# Patient Record
Sex: Male | Born: 1970 | Race: White | Hispanic: No | Marital: Single | Smoking: Former smoker
Health system: Southern US, Community
[De-identification: ages and names within clinical notes are randomized; demographics above are authoritative.]

---

## 1993-08-26 HISTORY — PX: LIGAMENT REPAIR: SHX5444

## 2006-02-01 ENCOUNTER — Emergency Department: Payer: Self-pay | Admitting: Unknown Physician Specialty

## 2007-12-21 IMAGING — CR RIGHT ANKLE - COMPLETE 3+ VIEW
1 series · 5 of 5 positions shown · non-contrast
Comparison: none

REASON FOR EXAM: injury
COMMENTS:  LMP: (Male)

PROCEDURE:     DXR - DXR ANKLE RIGHT COMPLETE  - February 01, 2006  [DATE]
RESULT:          Soft tissue swelling is appreciated within the lateral
malleolar region.  No evidence of acute fracture or dislocation is
appreciated.

[Series 1: view not recorded · 0.17mm/px · 5 of 5 slices shown]
[im 1/5]
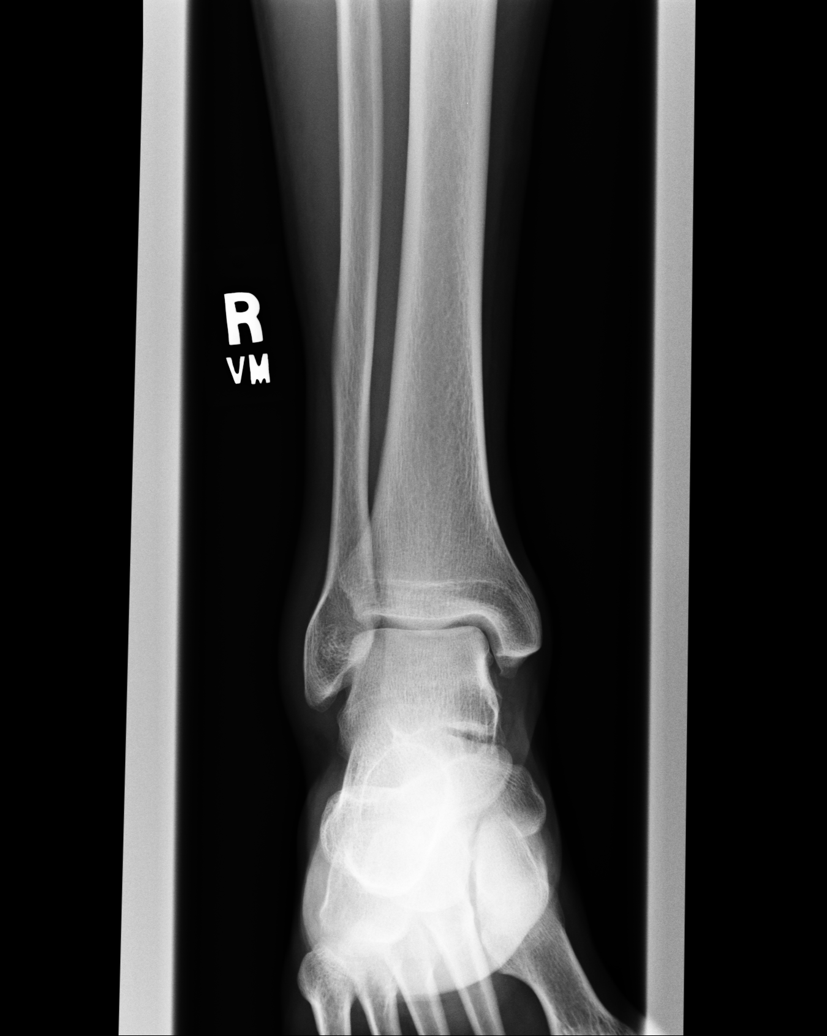
[im 2/5]
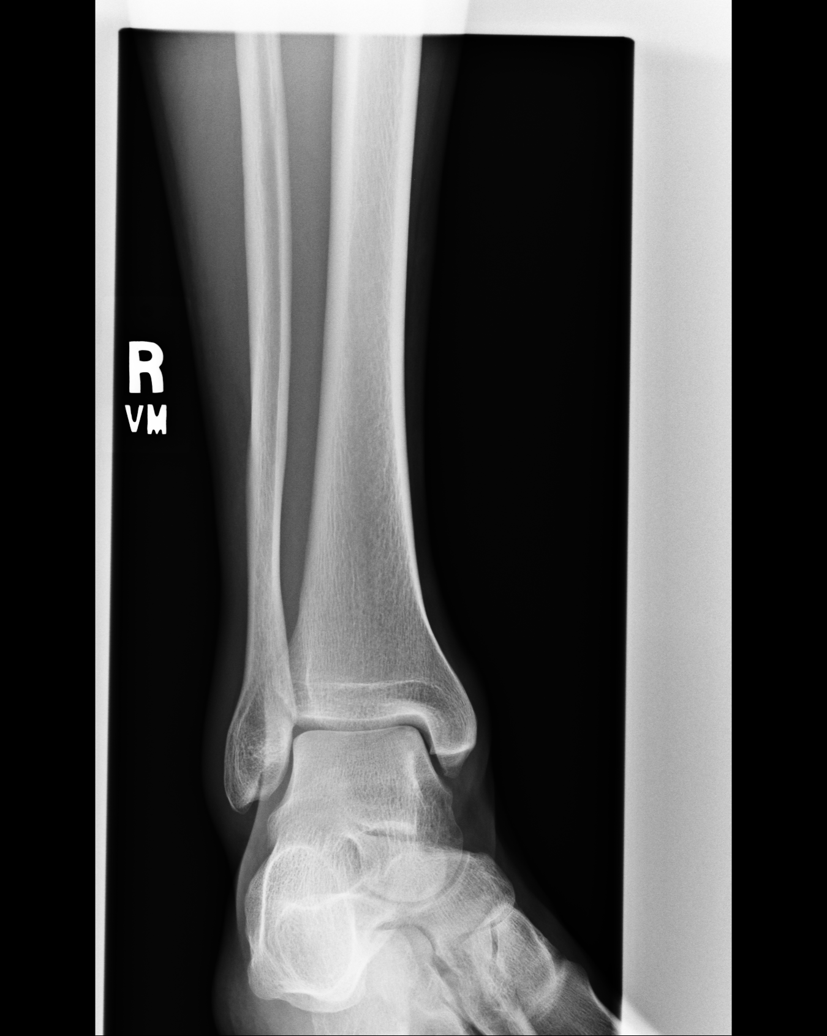
[im 3/5]
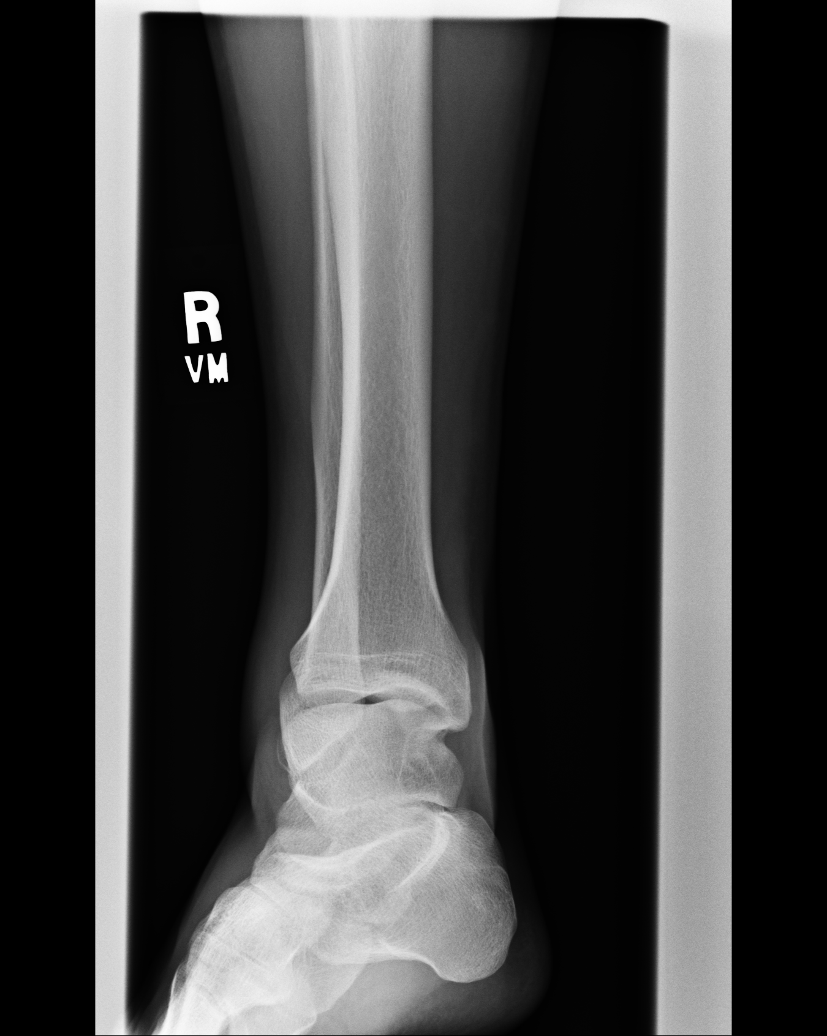
[im 4/5]
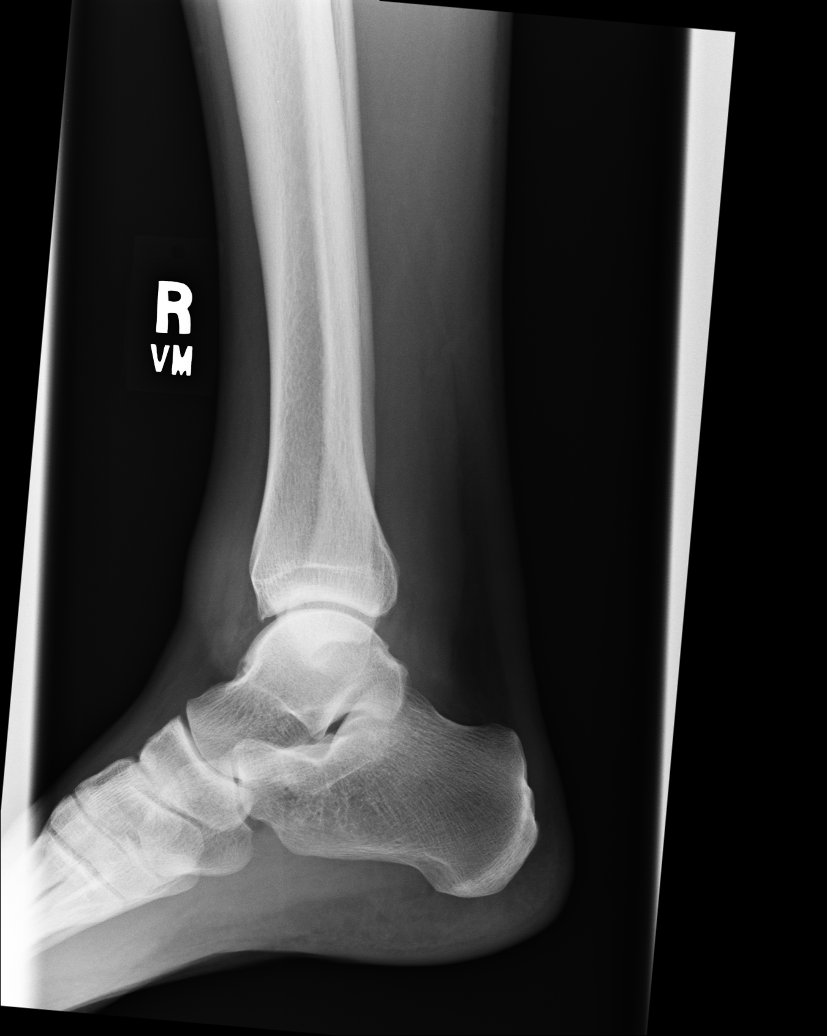
[im 5/5]
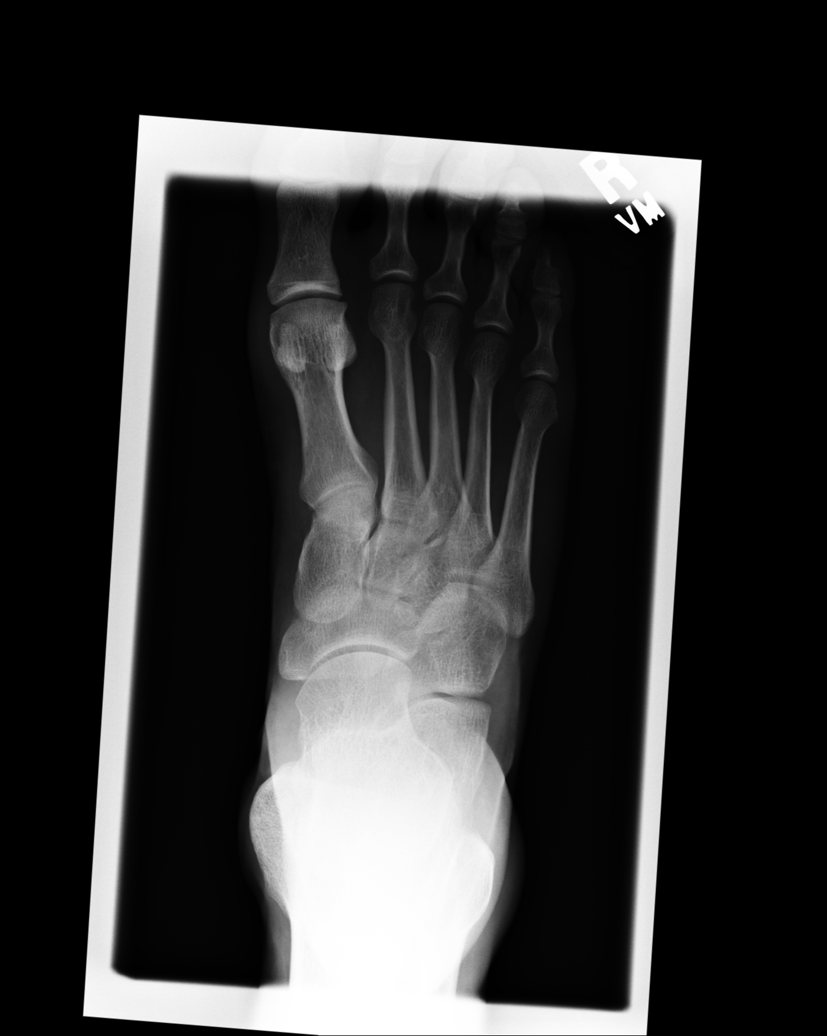

[5 of 5 positions shown; findings below may reference images not displayed]

IMPRESSION: Findings suggesting an element of ligamentous injury
involving the lateral malleolus region without evidence of acute fracture or
dislocation.

## 2017-05-09 ENCOUNTER — Ambulatory Visit: Payer: Self-pay | Admitting: Family Medicine

## 2017-05-12 ENCOUNTER — Encounter: Payer: Self-pay | Admitting: Family Medicine

## 2017-05-12 ENCOUNTER — Ambulatory Visit (INDEPENDENT_AMBULATORY_CARE_PROVIDER_SITE_OTHER): Payer: BLUE CROSS/BLUE SHIELD | Admitting: Family Medicine

## 2017-05-12 VITALS — BP 118/84 | HR 88 | Temp 97.6°F | Resp 16 | Ht 73.0 in | Wt 177.0 lb

## 2017-05-12 DIAGNOSIS — R1030 Lower abdominal pain, unspecified: Secondary | ICD-10-CM

## 2017-05-12 DIAGNOSIS — Z0000 Encounter for general adult medical examination without abnormal findings: Secondary | ICD-10-CM | POA: Diagnosis not present

## 2017-05-12 DIAGNOSIS — Z114 Encounter for screening for human immunodeficiency virus [HIV]: Secondary | ICD-10-CM | POA: Diagnosis not present

## 2017-05-12 DIAGNOSIS — R030 Elevated blood-pressure reading, without diagnosis of hypertension: Secondary | ICD-10-CM | POA: Diagnosis not present

## 2017-05-12 DIAGNOSIS — R109 Unspecified abdominal pain: Secondary | ICD-10-CM | POA: Insufficient documentation

## 2017-05-12 NOTE — Assessment & Plan Note (Signed)
Benign exam and no red flags Suspect gas and bloating from diet that is improving with decreased fried foods Return precautions discussed Continue to decreased high fat foods to see if this continues to help

## 2017-05-12 NOTE — Progress Notes (Signed)
Patient: Jeffrey Sanchez, Male    DOB: 09-Sep-1970, 46 y.o.   MRN: [id number] Visit Date: 05/12/2017  Today's Provider: Lavon Paganini, MD   Chief Complaint  Patient presents with  . Establish Care  . Annual Exam   Subjective:   Mr. Jeffrey Sanchez presents to establish care. Is is c/o intermittent lower abdominal pain - suprapubic. Worse after eating.  Occurs about once weekly.  Associated with bloating.  Better when not eating fried foods.  He has never had a colonoscopy. He denies diarrhea, constipation, melena, hematochezia, N/V, urinary complaints. He describes the pain as achy. He has not tried anything for the pain, and nothing improves the pain.  About one month ago, he noticed an episode of blurred vision, malaise and headache. He works outside. He checked his BP at that time, and it was 147/88. He denies syncope, presyncope.  Better after drinking water.  Thinks he was dehydrated that day as it was very hot.   Annual physical exam Jeffrey Sanchez is a 46 y.o. male who presents today for health maintenance and complete physical. He feels fairly well. He reports he is active at work, but is not regularly exercising. He reports he is sleeping fairly well.  -----------------------------------------------------------------   Review of Systems  Constitutional: Negative.   HENT: Positive for rhinorrhea and sneezing. Negative for congestion, dental problem, drooling, ear discharge, ear pain, facial swelling, hearing loss, mouth sores, nosebleeds, postnasal drip, sinus pain, sinus pressure, sore throat, tinnitus, trouble swallowing and voice change.   Eyes: Negative.   Respiratory: Negative.   Cardiovascular: Negative.   Gastrointestinal: Negative.   Endocrine: Negative.   Genitourinary: Negative.   Musculoskeletal: Negative.   Skin: Negative.   Allergic/Immunologic: Negative.   Neurological: Negative.   Hematological: Negative.   Psychiatric/Behavioral: Negative.     Social  History      He  reports that he quit smoking about 2 years ago. He has a 56.00 pack-year smoking history. His smokeless tobacco use includes Chew. He reports that he drinks alcohol. He reports that he does not use drugs.       Social History   Social History  . Marital status: Single    Spouse name: N/A  . Number of children: 1  . Years of education: HS   Occupational History  .  [employer]   Social History Main Topics  . Smoking status: Former Smoker    Packs/day: 2.00    Years: 28.00    Quit date: 08/25/2014  . Smokeless tobacco: Current User    Types: Chew  . Alcohol use Yes     Comment: occasionally  . Drug use: No  . Sexual activity: Yes   Other Topics Concern  . None   Social History Narrative  . None    History reviewed. No pertinent past medical history.   There are no active problems to display for this patient.   Past Surgical History:  Procedure Laterality Date  . LIGAMENT REPAIR Left    Ankle    Family History        Family Status  Relation Status  . Mother Deceased  . Father Alive  . Sister Alive  . Brother Alive  . Daughter (Not Specified)  . Brother Alive        His family history includes Cancer in his mother; Healthy in his brother, brother, father, and sister; Hyperlipidemia in his daughter.     No Known Allergies  No current outpatient prescriptions on file.   Patient Care Team: Virginia Crews, MD as PCP - General (Family Medicine)      Objective:   Vitals: BP 118/84 (BP Location: Left Arm, Patient Position: Sitting, Cuff Size: Normal)   Pulse 88   Temp 97.6 F (36.4 C) (Oral)   Resp 16   Ht 6\' 1"  (1.854 m)   Wt 177 lb (80.3 kg)   BMI 23.35 kg/m    Vitals:   05/12/17 0942  BP: 118/84  Pulse: 88  Resp: 16  Temp: 97.6 F (36.4 C)  TempSrc: Oral  Weight: 177 lb (80.3 kg)  Height: 6\' 1"  (1.854 m)     Physical Exam  Constitutional: He is oriented to person, place, and time. He appears  well-developed and well-nourished. No distress.  HENT:  Head: Normocephalic and atraumatic.  Right Ear: External ear normal.  Left Ear: External ear normal.  Nose: Nose normal.  Mouth/Throat: Oropharynx is clear and moist.  Eyes: Conjunctivae are normal. No scleral icterus.  Neck: Neck supple. No thyromegaly present.  Cardiovascular: Normal rate, regular rhythm, normal heart sounds and intact distal pulses.   No murmur heard. Pulmonary/Chest: Effort normal and breath sounds normal. No respiratory distress. He has no wheezes. He has no rales.  Abdominal: Soft. Bowel sounds are normal. He exhibits no distension. There is no tenderness. There is no rebound and no guarding. No hernia.  Musculoskeletal: He exhibits no edema or deformity.  Lymphadenopathy:    He has no cervical adenopathy.  Neurological: He is alert and oriented to person, place, and time.  Skin: Skin is warm and dry. No rash noted.  Psychiatric: He has a normal mood and affect. His behavior is normal.  Vitals reviewed.    Depression Screen PHQ 2/9 Scores 05/12/2017  PHQ - 2 Score 0     Assessment & Plan:     Routine Health Maintenance and Physical Exam  Exercise Activities and Dietary recommendations Goals    None       There is no immunization history on file for this patient.  Health Maintenance  Topic Date Due  . HIV Screening  08/25/1984  . TETANUS/TDAP  08/25/1988  . INFLUENZA VACCINE  03/26/2017     Discussed health benefits of physical activity, and encouraged him to engage in regular exercise appropriate for his age and condition.  - declined flu vaccine - will give TDAP at next visit as our stock is currently unavailable   -------------------------------------------------------------------- Problem List Items Addressed This Visit      Other   Elevated BP without diagnosis of hypertension    Well controlled today No indication for medications at this time Could have been related to heat  and dehydration Advised to keep BP log and RTC in 3 months to re-eval      Relevant Orders   COMPLETE METABOLIC PANEL WITH GFR   Lipid panel   Abdominal pain    Benign exam and no red flags Suspect gas and bloating from diet that is improving with decreased fried foods Return precautions discussed Continue to decreased high fat foods to see if this continues to help       Other Visit Diagnoses    Healthcare maintenance    -  Primary   Relevant Orders   COMPLETE METABOLIC PANEL WITH GFR   CBC w/Diff/Platelet   Lipid panel   Screening for HIV (human immunodeficiency virus)       Relevant Orders   HIV  antibody (with reflex)     Return in about 3 months (around 08/11/2017) for BP.   The entirety of the information documented in the History of Present Illness, Review of Systems and Physical Exam were personally obtained by me. Portions of this information were initially documented by Raquel Sarna Ratchford, CMA and reviewed by me for thoroughness and accuracy.    Lavon Paganini, MD  [facility]

## 2017-05-12 NOTE — Patient Instructions (Signed)
Hypertension Hypertension is another name for high blood pressure. High blood pressure forces your heart to work harder to pump blood. This can cause problems over time. There are two numbers in a blood pressure reading. There is a top number (systolic) over a bottom number (diastolic). It is best to have a blood pressure below 120/80. Healthy choices can help lower your blood pressure. You may need medicine to help lower your blood pressure if:  Your blood pressure cannot be lowered with healthy choices.  Your blood pressure is higher than 130/80.  Follow these instructions at home: Eating and drinking  If directed, follow the DASH eating plan. This diet includes: ? Filling half of your plate at each meal with fruits and vegetables. ? Filling one quarter of your plate at each meal with whole grains. Whole grains include whole wheat pasta, brown rice, and whole grain bread. ? Eating or drinking low-fat dairy products, such as skim milk or low-fat yogurt. ? Filling one quarter of your plate at each meal with low-fat (lean) proteins. Low-fat proteins include fish, skinless chicken, eggs, beans, and tofu. ? Avoiding fatty meat, cured and processed meat, or chicken with skin. ? Avoiding premade or processed food.  Eat less than 1,500 mg of salt (sodium) a day.  Limit alcohol use to no more than 1 drink a day for nonpregnant women and 2 drinks a day for men. One drink equals 12 oz of beer, 5 oz of wine, or 1 oz of hard liquor. Lifestyle  Work with your doctor to stay at a healthy weight or to lose weight. Ask your doctor what the best weight is for you.  Get at least 30 minutes of exercise that causes your heart to beat faster (aerobic exercise) most days of the week. This may include walking, swimming, or biking.  Get at least 30 minutes of exercise that strengthens your muscles (resistance exercise) at least 3 days a week. This may include lifting weights or pilates.  Do not use any  products that contain nicotine or tobacco. This includes cigarettes and e-cigarettes. If you need help quitting, ask your doctor.  Check your blood pressure at home as told by your doctor.  Keep all follow-up visits as told by your doctor. This is important. Medicines  Take over-the-counter and prescription medicines only as told by your doctor. Follow directions carefully.  Do not skip doses of blood pressure medicine. The medicine does not work as well if you skip doses. Skipping doses also puts you at risk for problems.  Ask your doctor about side effects or reactions to medicines that you should watch for. Contact a doctor if:  You think you are having a reaction to the medicine you are taking.  You have headaches that keep coming back (recurring).  You feel dizzy.  You have swelling in your ankles.  You have trouble with your vision. Get help right away if:  You get a very bad headache.  You start to feel confused.  You feel weak or numb.  You feel faint.  You get very bad pain in your: ? Chest. ? Belly (abdomen).  You throw up (vomit) more than once.  You have trouble breathing. Summary  Hypertension is another name for high blood pressure.  Making healthy choices can help lower blood pressure. If your blood pressure cannot be controlled with healthy choices, you may need to take medicine. This information is not intended to replace advice given to you by your health care   provider. Make sure you discuss any questions you have with your health care provider. Document Released: 01/29/2008 Document Revised: 07/10/2016 Document Reviewed: 07/10/2016 Elsevier Interactive Patient Education  2018 [place] Years, Male Preventive care refers to lifestyle choices and visits with your health care provider that can promote health and wellness. What does preventive care include?  A yearly physical exam. This is also called an annual well  check.  Dental exams once or twice a year.  Routine eye exams. Ask your health care provider how often you should have your eyes checked.  Personal lifestyle choices, including: ? Daily care of your teeth and gums. ? Regular physical activity. ? Eating a healthy diet. ? Avoiding tobacco and drug use. ? Limiting alcohol use. ? Practicing safe sex. ? Taking low-dose aspirin every day starting at age 29. What happens during an annual well check? The services and screenings done by your health care provider during your annual well check will depend on your age, overall health, lifestyle risk factors, and family history of disease. Counseling Your health care provider may ask you questions about your:  Alcohol use.  Tobacco use.  Drug use.  Emotional well-being.  Home and relationship well-being.  Sexual activity.  Eating habits.  Work and work Statistician.  Screening You may have the following tests or measurements:  Height, weight, and BMI.  Blood pressure.  Lipid and cholesterol levels. These may be checked every 5 years, or more frequently if you are over 31 years old.  Skin check.  Lung cancer screening. You may have this screening every year starting at age 81 if you have a 30-pack-year history of smoking and currently smoke or have quit within the past 15 years.  Fecal occult blood test (FOBT) of the stool. You may have this test every year starting at age 77.  Flexible sigmoidoscopy or colonoscopy. You may have a sigmoidoscopy every 5 years or a colonoscopy every 10 years starting at age 81.  Prostate cancer screening. Recommendations will vary depending on your family history and other risks.  Hepatitis C blood test.  Hepatitis B blood test.  Sexually transmitted disease (STD) testing.  Diabetes screening. This is done by checking your blood sugar (glucose) after you have not eaten for a while (fasting). You may have this done every 1-3  years.  Discuss your test results, treatment options, and if necessary, the need for more tests with your health care provider. Vaccines Your health care provider may recommend certain vaccines, such as:  Influenza vaccine. This is recommended every year.  Tetanus, diphtheria, and acellular pertussis (Tdap, Td) vaccine. You may need a Td booster every 10 years.  Varicella vaccine. You may need this if you have not been vaccinated.  Zoster vaccine. You may need this after age 17.  Measles, mumps, and rubella (MMR) vaccine. You may need at least one dose of MMR if you were born in 1957 or later. You may also need a second dose.  Pneumococcal 13-valent conjugate (PCV13) vaccine. You may need this if you have certain conditions and have not been vaccinated.  Pneumococcal polysaccharide (PPSV23) vaccine. You may need one or two doses if you smoke cigarettes or if you have certain conditions.  Meningococcal vaccine. You may need this if you have certain conditions.  Hepatitis A vaccine. You may need this if you have certain conditions or if you travel or work in places where you may be exposed to hepatitis A.  Hepatitis B vaccine. You may need this if you have certain conditions or if you travel or work in places where you may be exposed to hepatitis B.  Haemophilus influenzae type b (Hib) vaccine. You may need this if you have certain risk factors.  Talk to your health care provider about which screenings and vaccines you need and how often you need them. This information is not intended to replace advice given to you by your health care provider. Make sure you discuss any questions you have with your health care provider. Document Released: 09/08/2015 Document Revised: 05/01/2016 Document Reviewed: 06/13/2015 Elsevier Interactive Patient Education  2017 [employer].

## 2017-05-12 NOTE — Assessment & Plan Note (Signed)
Well controlled today No indication for medications at this time Could have been related to heat and dehydration Advised to keep BP log and RTC in 3 months to re-eval

## 2017-05-13 ENCOUNTER — Telehealth: Payer: Self-pay

## 2017-05-13 LAB — COMPLETE METABOLIC PANEL WITH GFR
AG Ratio: 1.9 (calc) (ref 1.0–2.5)
ALT: 50 U/L — AB (ref 9–46)
AST: 23 U/L (ref 10–40)
Albumin: 5.1 g/dL (ref 3.6–5.1)
Alkaline phosphatase (APISO): 60 U/L (ref 40–115)
BILIRUBIN TOTAL: 0.6 mg/dL (ref 0.2–1.2)
BUN: 12 mg/dL (ref 7–25)
CHLORIDE: 99 mmol/L (ref 98–110)
CO2: 31 mmol/L (ref 20–32)
CREATININE: 0.96 mg/dL (ref 0.60–1.35)
Calcium: 10 mg/dL (ref 8.6–10.3)
GFR, EST AFRICAN AMERICAN: 109 mL/min/{1.73_m2} (ref >=60)
GFR, Est Non African American: 94 mL/min/{1.73_m2} (ref >=60)
Globulin: 2.7 g/dL (calc) (ref 1.9–3.7)
Glucose, Bld: 104 mg/dL — ABNORMAL HIGH (ref 65–99)
Potassium: 4.4 mmol/L (ref 3.5–5.3)
Sodium: 138 mmol/L (ref 135–146)
TOTAL PROTEIN: 7.8 g/dL (ref 6.1–8.1)

## 2017-05-13 LAB — CBC WITH DIFFERENTIAL/PLATELET
BASOS ABS: 59 {cells}/uL (ref 0–200)
Basophils Relative: 0.9 %
Eosinophils Absolute: 251 cells/uL (ref 15–500)
Eosinophils Relative: 3.8 %
HEMATOCRIT: 50.4 % — AB (ref 38.5–50.0)
HEMOGLOBIN: 17.3 g/dL — AB (ref 13.2–17.1)
LYMPHS ABS: 2244 {cells}/uL (ref 850–3900)
MCH: 29.4 pg (ref 27.0–33.0)
MCHC: 34.3 g/dL (ref 32.0–36.0)
MCV: 85.7 fL (ref 80.0–100.0)
MPV: 10.2 fL (ref 7.5–12.5)
Monocytes Relative: 8.2 %
NEUTROS ABS: 3505 {cells}/uL (ref 1500–7800)
Neutrophils Relative %: 53.1 %
Platelets: 267 10*3/uL (ref 140–400)
RBC: 5.88 10*6/uL — AB (ref 4.20–5.80)
RDW: 13.5 % (ref 11.0–15.0)
Total Lymphocyte: 34 %
WBC: 6.6 10*3/uL (ref 3.8–10.8)
WBCMIX: 541 {cells}/uL (ref 200–950)

## 2017-05-13 LAB — LIPID PANEL
CHOL/HDL RATIO: 5.6 (calc) — AB (ref <5.0)
Cholesterol: 257 mg/dL — ABNORMAL HIGH (ref <200)
HDL: 46 mg/dL (ref >40)
LDL Cholesterol (Calc): 170 mg/dL (calc) — ABNORMAL HIGH
NON-HDL CHOLESTEROL (CALC): 211 mg/dL — AB (ref <130)
TRIGLYCERIDES: 244 mg/dL — AB (ref <150)

## 2017-05-13 LAB — HIV ANTIBODY (ROUTINE TESTING W REFLEX): HIV: NONREACTIVE

## 2017-05-13 NOTE — Telephone Encounter (Signed)
-----   Message from Virginia Crews, MD sent at 05/13/2017 11:24 AM EDT ----- Negative HIV screening.  Normal kidney function, liver function, electrolytes (except, blood sugar is slightly elevated if he was fasting and ALT a liver function testing is barely elevated above normal).  Normal blood counts.  Cholesterol is high, but not to a level that requires medicaiton absolutely at this time.  Would recommend decreasing carbohydrate and fatty food intake and getting regular exercise.  Virginia Crews, MD, MPH [facility] 05/13/2017 11:23 AM

## 2017-05-13 NOTE — Telephone Encounter (Signed)
lmtcb

## 2017-05-13 NOTE — Telephone Encounter (Signed)
Patient advised as directed below.  Thanks,  -Daiton Cowles 

## 2017-05-14 ENCOUNTER — Telehealth: Payer: Self-pay

## 2017-05-14 NOTE — Telephone Encounter (Signed)
Patient advised as below. Patient verbalizes understanding and is in agreement with treatment plan. Patient reports he had regular soda and coffee before blood drawn.

## 2017-05-14 NOTE — Telephone Encounter (Signed)
-----   Message from Virginia Crews, MD sent at 05/13/2017 11:24 AM EDT ----- Negative HIV screening.  Normal kidney function, liver function, electrolytes (except, blood sugar is slightly elevated if he was fasting and ALT a liver function testing is barely elevated above normal).  Normal blood counts.  Cholesterol is high, but not to a level that requires medicaiton absolutely at this time.  Would recommend decreasing carbohydrate and fatty food intake and getting regular exercise.  Virginia Crews, MD, MPH [facility] 05/13/2017 11:23 AM

## 2017-08-11 ENCOUNTER — Other Ambulatory Visit: Payer: Self-pay

## 2017-08-11 ENCOUNTER — Encounter: Payer: Self-pay | Admitting: Family Medicine

## 2017-08-11 ENCOUNTER — Ambulatory Visit: Payer: BLUE CROSS/BLUE SHIELD | Admitting: Family Medicine

## 2017-08-11 VITALS — BP 104/78 | HR 93 | Temp 98.2°F | Resp 16 | Wt 178.0 lb

## 2017-08-11 DIAGNOSIS — R740 Nonspecific elevation of levels of transaminase and lactic acid dehydrogenase [LDH]: Secondary | ICD-10-CM | POA: Diagnosis not present

## 2017-08-11 DIAGNOSIS — R7401 Elevation of levels of liver transaminase levels: Secondary | ICD-10-CM

## 2017-08-11 DIAGNOSIS — Z23 Encounter for immunization: Secondary | ICD-10-CM

## 2017-08-11 DIAGNOSIS — R030 Elevated blood-pressure reading, without diagnosis of hypertension: Secondary | ICD-10-CM | POA: Diagnosis not present

## 2017-08-11 DIAGNOSIS — D582 Other hemoglobinopathies: Secondary | ICD-10-CM

## 2017-08-11 NOTE — Progress Notes (Signed)
Patient: Jeffrey Sanchez Male    DOB: [birth year]   46 y.o.   MRN: [id number] Visit Date: 08/11/2017  Today's Provider: Lavon Paganini, MD   Chief Complaint  Patient presents with  . Elevated Blood Pressure Follow Up   I, Emily Ratchford, CMA, am acting as scribe for Lavon Paganini, MD.  Subjective:    HPI     Follow up for Elevated BP without HTN Diagnosis  The patient was last seen for this 3 months ago. Changes made at last visit include none; pt was advised elevated BP could have been secondary to heat and dehydration. Pt was advised to keep BP log and FU in 3 months.  He reports fair compliance with treatment. He states he does not have a way to monitor his BP, but he also states he has not had any problems since the first incident.  He feels that condition is Improved.  ------------------------------------------------------------------------------------ BP Readings from Last 3 Encounters:  08/11/17 104/78  05/12/17 118/84     No Known Allergies  No current outpatient medications on file.  Review of Systems  Constitutional: Negative for activity change, appetite change, chills, diaphoresis, fatigue, fever and unexpected weight change.  Respiratory: Negative for shortness of breath.   Cardiovascular: Negative for chest pain, palpitations and leg swelling.    Social History   Tobacco Use  . Smoking status: Former Smoker    Packs/day: 2.00    Years: 28.00    Pack years: 56.00    Types: Cigarettes    Last attempt to quit: 08/25/2014    Years since quitting: 2.9  . Smokeless tobacco: Current User    Types: Chew  Substance Use Topics  . Alcohol use: Yes    Comment: occasionally   Objective:   BP 104/78 (BP Location: Left Arm, Patient Position: Sitting, Cuff Size: Normal)   Pulse 93   Temp 98.2 F (36.8 C) (Oral)   Resp 16   Wt 178 lb (80.7 kg)   SpO2 96%   BMI 23.48 kg/m  Vitals:   08/11/17 1509  BP: 104/78  Pulse: 93  Resp: 16    Temp: 98.2 F (36.8 C)  TempSrc: Oral  SpO2: 96%  Weight: 178 lb (80.7 kg)     Physical Exam  Constitutional: He appears well-developed and well-nourished. No distress.  HENT:  Head: Normocephalic and atraumatic.  Eyes: Conjunctivae are normal.  Neck: Neck supple. No thyromegaly present.  Cardiovascular: Normal rate, regular rhythm, normal heart sounds and intact distal pulses.  No murmur heard. Pulmonary/Chest: Effort normal and breath sounds normal. No respiratory distress. He has no wheezes. He has no rales.  Musculoskeletal: He exhibits no edema or deformity.  Lymphadenopathy:    He has no cervical adenopathy.  Neurological: He is alert.  Skin: Skin is warm and dry. No rash noted.  Psychiatric: He has a normal mood and affect. His behavior is normal.  Vitals reviewed.       Assessment & Plan:     1. Elevated BP without diagnosis of hypertension - now resolved - suspect incident with 1 time elevated BP was related to being overheated at work/heat exhaustion - can continue to monitor at CPE  2. Elevated ALT measurement, 3. Elevated hemoglobin (HCC) - CBC w/Diff/Platelet, CMP with GFR - suspect both related to heat exhaustion/dehydration at initial visit - asymptomatic - will recheck labs  4. Need for Tdap vaccination - Tdap vaccine greater than or equal to 7yo IM  Return in about 1 year (around 08/11/2018) for physical.     The entirety of the information documented in the History of Present Illness, Review of Systems and Physical Exam were personally obtained by me. Portions of this information were initially documented by Raquel Sarna Ratchford, CMA and reviewed by me for thoroughness and accuracy.    Virginia Crews, MD, MPH [facility] 08/13/2017 8:12 AM

## 2017-08-12 LAB — CBC WITH DIFFERENTIAL/PLATELET
BASOS PCT: 1.2 %
Basophils Absolute: 73 cells/uL (ref 0–200)
EOS ABS: 220 {cells}/uL (ref 15–500)
EOS PCT: 3.6 %
HCT: 43.7 % (ref 38.5–50.0)
HEMOGLOBIN: 15.5 g/dL (ref 13.2–17.1)
Lymphs Abs: 2031 cells/uL (ref 850–3900)
MCH: 29.8 pg (ref 27.0–33.0)
MCHC: 35.5 g/dL (ref 32.0–36.0)
MCV: 83.9 fL (ref 80.0–100.0)
MPV: 9.9 fL (ref 7.5–12.5)
Monocytes Relative: 7.7 %
NEUTROS ABS: 3306 {cells}/uL (ref 1500–7800)
Neutrophils Relative %: 54.2 %
PLATELETS: 262 10*3/uL (ref 140–400)
RBC: 5.21 10*6/uL (ref 4.20–5.80)
RDW: 13 % (ref 11.0–15.0)
TOTAL LYMPHOCYTE: 33.3 %
WBC mixed population: 470 cells/uL (ref 200–950)
WBC: 6.1 10*3/uL (ref 3.8–10.8)

## 2017-08-12 LAB — COMPLETE METABOLIC PANEL WITH GFR
AG RATIO: 2 (calc) (ref 1.0–2.5)
ALT: 27 U/L (ref 9–46)
AST: 13 U/L (ref 10–40)
Albumin: 4.5 g/dL (ref 3.6–5.1)
Alkaline phosphatase (APISO): 45 U/L (ref 40–115)
BILIRUBIN TOTAL: 0.5 mg/dL (ref 0.2–1.2)
BUN: 12 mg/dL (ref 7–25)
CO2: 25 mmol/L (ref 20–32)
Calcium: 9.1 mg/dL (ref 8.6–10.3)
Chloride: 102 mmol/L (ref 98–110)
Creat: 0.97 mg/dL (ref 0.60–1.35)
GFR, Est African American: 107 mL/min/{1.73_m2} (ref >=60)
GFR, Est Non African American: 93 mL/min/{1.73_m2} (ref >=60)
Globulin: 2.2 g/dL (calc) (ref 1.9–3.7)
Glucose, Bld: 124 mg/dL — ABNORMAL HIGH (ref 65–99)
POTASSIUM: 4 mmol/L (ref 3.5–5.3)
Sodium: 139 mmol/L (ref 135–146)
Total Protein: 6.7 g/dL (ref 6.1–8.1)

## 2017-09-02 ENCOUNTER — Encounter: Payer: Self-pay | Admitting: Physician Assistant

## 2017-09-02 ENCOUNTER — Ambulatory Visit (INDEPENDENT_AMBULATORY_CARE_PROVIDER_SITE_OTHER): Payer: BLUE CROSS/BLUE SHIELD | Admitting: Physician Assistant

## 2017-09-02 VITALS — BP 142/88 | HR 100 | Temp 98.3°F | Resp 16 | Wt 179.0 lb

## 2017-09-02 DIAGNOSIS — J029 Acute pharyngitis, unspecified: Secondary | ICD-10-CM | POA: Diagnosis not present

## 2017-09-02 MED ORDER — AMOXICILLIN-POT CLAVULANATE 875-125 MG PO TABS: 1.0000 | ORAL_TABLET | Freq: Two times a day (BID) | ORAL | 0 refills | Status: AC

## 2017-09-02 NOTE — Patient Instructions (Signed)

## 2017-09-02 NOTE — Progress Notes (Signed)
Patient: Jeffrey Sanchez Male    DOB: 11-06-1970   47 y.o.   MRN: [id number] Visit Date: 09/02/2017  Today's Provider: Trinna Post, PA-C   Chief Complaint  Patient presents with  . Sore Throat   Subjective:    Jeffrey Sanchez is a 47 y.o man presenting today for sore throat. Pertinents below.  Sore Throat   This is a new problem. The current episode started in the past 7 days. The problem has been gradually worsening. The pain is worse on the right side. Associated symptoms include congestion, coughing, headaches, neck pain, swollen glands and trouble swallowing. Pertinent negatives include no abdominal pain, diarrhea, drooling, ear discharge, ear pain, hoarse voice, plugged ear sensation, shortness of breath or stridor. He has tried NSAIDs for the symptoms. The treatment provided no relief.       No Known Allergies  No current outpatient medications on file.  Review of Systems  Constitutional: Positive for fatigue. Negative for activity change, appetite change, chills, diaphoresis, fever and unexpected weight change.  HENT: Positive for congestion, sinus pressure, sinus pain, sore throat and trouble swallowing. Negative for dental problem, drooling, ear discharge, ear pain, facial swelling, hearing loss, hoarse voice, mouth sores, nosebleeds, postnasal drip, rhinorrhea, sneezing, tinnitus and voice change.   Respiratory: Positive for cough and chest tightness. Negative for apnea, choking, shortness of breath, wheezing and stridor.   Gastrointestinal: Negative.  Negative for abdominal pain and diarrhea.  Musculoskeletal: Positive for neck pain.  Neurological: Positive for headaches. Negative for dizziness and light-headedness.    Social History   Tobacco Use  . Smoking status: Former Smoker    Packs/day: 2.00    Years: 28.00    Pack years: 56.00    Types: Cigarettes    Last attempt to quit: 08/25/2014    Years since quitting: 3.0  . Smokeless tobacco: Current User   Types: Chew  Substance Use Topics  . Alcohol use: Yes    Comment: occasionally   Objective:   BP (!) 142/88 (BP Location: Right Arm, Patient Position: Sitting, Cuff Size: Normal)   Pulse 100   Temp 98.3 F (36.8 C) (Oral)   Resp 16   Wt 179 lb (81.2 kg)   BMI 23.62 kg/m  Vitals:   09/02/17 1649  BP: (!) 142/88  Pulse: 100  Resp: 16  Temp: 98.3 F (36.8 C)  TempSrc: Oral  Weight: 179 lb (81.2 kg)     Physical Exam  Constitutional: He is oriented to person, place, and time. He appears well-developed and well-nourished.  HENT:  Right Ear: Tympanic membrane and external ear normal.  Left Ear: Tympanic membrane and external ear normal.  Mouth/Throat: Oropharyngeal exudate, posterior oropharyngeal edema and posterior oropharyngeal erythema present.  Hypertrophic tonsils with exudate.  Cardiovascular: Normal rate and regular rhythm.  Pulmonary/Chest: Effort normal and breath sounds normal.  Lymphadenopathy:    He has cervical adenopathy.  Neurological: He is alert and oriented to person, place, and time.  Skin: Skin is warm and dry.  Psychiatric: He has a normal mood and affect. His behavior is normal.        Assessment & Plan:     1. Pharyngitis, unspecified etiology  Rapid strep negative, however oropharynx is extremely edematous and tonsils are hypertrophic with some exudate. Will cover for bacterial pharyngitis.  - amoxicillin-clavulanate (AUGMENTIN) 875-125 MG tablet; Take 1 tablet by mouth 2 (two) times daily for 7 days.  Dispense: 14 tablet; Refill: 0 -  POCT rapid strep A  Return if symptoms worsen or fail to improve.  The entirety of the information documented in the History of Present Illness, Review of Systems and Physical Exam were personally obtained by me. Portions of this information were initially documented by Ashley Royalty, CMA and reviewed by me for thoroughness and accuracy.        Trinna Post, PA-C  [facility]

## 2017-09-03 LAB — POCT RAPID STREP A (OFFICE): Rapid Strep A Screen: NEGATIVE

## 2017-12-07 DIAGNOSIS — J029 Acute pharyngitis, unspecified: Secondary | ICD-10-CM | POA: Diagnosis not present

## 2017-12-07 DIAGNOSIS — B9689 Other specified bacterial agents as the cause of diseases classified elsewhere: Secondary | ICD-10-CM | POA: Diagnosis not present

## 2017-12-07 DIAGNOSIS — R05 Cough: Secondary | ICD-10-CM | POA: Diagnosis not present

## 2017-12-07 DIAGNOSIS — J019 Acute sinusitis, unspecified: Secondary | ICD-10-CM | POA: Diagnosis not present

## 2018-04-30 ENCOUNTER — Ambulatory Visit (INDEPENDENT_AMBULATORY_CARE_PROVIDER_SITE_OTHER): Payer: BLUE CROSS/BLUE SHIELD | Admitting: Family Medicine

## 2018-04-30 ENCOUNTER — Encounter: Payer: Self-pay | Admitting: Family Medicine

## 2018-04-30 VITALS — BP 118/84 | HR 83 | Temp 98.0°F | Wt 179.2 lb

## 2018-04-30 DIAGNOSIS — J301 Allergic rhinitis due to pollen: Secondary | ICD-10-CM

## 2018-04-30 DIAGNOSIS — J0100 Acute maxillary sinusitis, unspecified: Secondary | ICD-10-CM

## 2018-04-30 MED ORDER — AMOXICILLIN-POT CLAVULANATE 875-125 MG PO TABS: 1.0000 | ORAL_TABLET | Freq: Two times a day (BID) | ORAL | 0 refills | Status: AC

## 2018-04-30 MED ORDER — FLUTICASONE PROPIONATE 50 MCG/ACT NA SUSP: 2.0000 | Freq: Every day | NASAL | 6 refills | Status: DC

## 2018-04-30 NOTE — Progress Notes (Signed)
Patient: Jeffrey Sanchez Male    DOB: 1970/09/24   47 y.o.   MRN: [id number] Visit Date: 04/30/2018  Today's Provider: Lavon Paganini, MD   Chief Complaint  Patient presents with  . URI   Subjective:    I, Tiburcio Pea, CMA, am acting as a scribe for Lavon Paganini, MD.   URI   This is a new problem. Episode onset: 10 days ago. The problem has been gradually worsening. There has been no fever. Associated symptoms include congestion, coughing, headaches, rhinorrhea, sinus pain, sneezing and a sore throat. Pertinent negatives include no ear pain or wheezing. Treatments tried: DyQuil and NyQuil. The treatment provided no relief.   He was treated for bacterial sinusitis in 11/2017 and states that the symptoms are very similar to then.  He works outside and does have allergy symptoms frequently.  He does not take antihistamines or any other treatment for allergies.    No Known Allergies  No current outpatient medications on file.  Review of Systems  Constitutional: Negative.   HENT: Positive for congestion, rhinorrhea, sinus pressure, sinus pain, sneezing and sore throat. Negative for dental problem, drooling, ear discharge, ear pain, facial swelling, hearing loss, mouth sores, nosebleeds, postnasal drip, tinnitus, trouble swallowing and voice change.   Eyes: Negative.   Respiratory: Positive for cough. Negative for apnea, choking, chest tightness, shortness of breath, wheezing and stridor.   Cardiovascular: Negative.   Musculoskeletal: Negative.   Neurological: Positive for headaches. Negative for dizziness, tremors, seizures, syncope, facial asymmetry, speech difficulty, weakness, light-headedness and numbness.  Psychiatric/Behavioral: Negative.     Social History   Tobacco Use  . Smoking status: Former Smoker    Packs/day: 2.00    Years: 28.00    Pack years: 56.00    Types: Cigarettes    Last attempt to quit: 08/25/2014    Years since quitting: 3.6  . Smokeless tobacco:  Current User    Types: Chew  Substance Use Topics  . Alcohol use: Yes    Comment: occasionally   Objective:   BP 118/84 (BP Location: Right Arm, Patient Position: Sitting, Cuff Size: Normal)   Pulse 83   Temp 98 F (36.7 C) (Oral)   Wt 179 lb 3.2 oz (81.3 kg)   SpO2 98%   BMI 23.64 kg/m  Vitals:   04/30/18 1450  BP: 118/84  Pulse: 83  Temp: 98 F (36.7 C)  TempSrc: Oral  SpO2: 98%  Weight: 179 lb 3.2 oz (81.3 kg)     Physical Exam  Constitutional: He is oriented to person, place, and time. He appears well-developed and well-nourished. No distress.  HENT:  Head: Normocephalic and atraumatic.  Right Ear: Tympanic membrane, external ear and ear canal normal.  Left Ear: Tympanic membrane, external ear and ear canal normal.  Nose: Mucosal edema present. Right sinus exhibits maxillary sinus tenderness and frontal sinus tenderness. Left sinus exhibits maxillary sinus tenderness and frontal sinus tenderness.  Mouth/Throat: Uvula is midline and mucous membranes are normal. Posterior oropharyngeal erythema present. No oropharyngeal exudate or posterior oropharyngeal edema.  Eyes: Pupils are equal, round, and reactive to light. Conjunctivae are normal. Right eye exhibits no discharge. Left eye exhibits no discharge. No scleral icterus.  Neck: Neck supple. No thyromegaly present.  Cardiovascular: Normal rate, regular rhythm, normal heart sounds and intact distal pulses.  No murmur heard. Pulmonary/Chest: Effort normal and breath sounds normal. No respiratory distress. He has no wheezes. He has no rales.  Musculoskeletal: He exhibits no edema.  Lymphadenopathy:    He has no cervical adenopathy.  Neurological: He is alert and oriented to person, place, and time.  Skin: Skin is warm and dry. Capillary refill takes less than 2 seconds. No rash noted.  Psychiatric: He has a normal mood and affect. His behavior is normal.  Vitals reviewed.      Assessment & Plan:   1. Acute  non-recurrent maxillary sinusitis -Symptoms consistent with sinusitis -Given duration of symptoms, more likely to be a bacterial etiology -Discussed importance of symptomatic management with decongestants and nasal sprays -Suspect that initial congestion stems from allergic rhinitis that is untreated -Treat with Augmentin 7-day course -Discussed return precautions  2. Seasonal allergic rhinitis due to pollen -New diagnosis - Seems to have issues with sinusitis around turns of seasons which is likely mediated by allergic rhinitis - Start daily antihistamine -Start Flonase treatment    Meds ordered this encounter  Medications  . amoxicillin-clavulanate (AUGMENTIN) 875-125 MG tablet    Sig: Take 1 tablet by mouth 2 (two) times daily for 7 days.    Dispense:  14 tablet    Refill:  0  . fluticasone (FLONASE) 50 MCG/ACT nasal spray    Sig: Place 2 sprays into both nostrils daily.    Dispense:  16 g    Refill:  6     Return if symptoms worsen or fail to improve.   The entirety of the information documented in the History of Present Illness, Review of Systems and Physical Exam were personally obtained by me. Portions of this information were initially documented by Tiburcio Pea, CMA and reviewed by me for thoroughness and accuracy.    Virginia Crews, MD, MPH [facility] 04/30/2018 4:31 PM

## 2018-04-30 NOTE — Patient Instructions (Signed)

## 2018-08-12 ENCOUNTER — Encounter: Payer: Self-pay | Admitting: Family Medicine

## 2018-10-19 ENCOUNTER — Encounter: Payer: BLUE CROSS/BLUE SHIELD | Admitting: Family Medicine

## 2018-10-19 NOTE — Progress Notes (Deleted)
Patient: Jeffrey Sanchez, Male    DOB: Aug 26, 1971, 48 y.o.   MRN: [id number] Visit Date: 10/19/2018  Today's Provider: Lavon Paganini, MD   No chief complaint on file.  Subjective:     Annual physical exam Jeffrey Sanchez is a 48 y.o. male who presents today for health maintenance and complete physical. He feels {DESC; WELL/FAIRLY WELL/POORLY:18703}. He reports exercising ***. He reports he is sleeping {DESC; WELL/FAIRLY WELL/POORLY:18703}.  -----------------------------------------------------------------   Review of Systems  Constitutional: Negative.   HENT: Negative.   Eyes: Negative.   Respiratory: Negative.   Cardiovascular: Negative.   Gastrointestinal: Negative.   Endocrine: Negative.   Genitourinary: Negative.   Musculoskeletal: Negative.   Skin: Negative.   Allergic/Immunologic: Negative.   Neurological: Negative.   Hematological: Negative.   Psychiatric/Behavioral: Negative.     Social History      He  reports that he quit smoking about 4 years ago. His smoking use included cigarettes. He has a 56.00 pack-year smoking history. His smokeless tobacco use includes chew. He reports current alcohol use. He reports that he does not use drugs.       Social History   Socioeconomic History  . Marital status: Single    Spouse name: Not on file  . Number of children: 1  . Years of education: HS  . Highest education level: Not on file  Occupational History    Employer: [employer]  Social Needs  . Financial resource strain: Not on file  . Food insecurity:    Worry: Not on file    Inability: Not on file  . Transportation needs:    Medical: Not on file    Non-medical: Not on file  Tobacco Use  . Smoking status: Former Smoker    Packs/day: 2.00    Years: 28.00    Pack years: 56.00    Types: Cigarettes    Last attempt to quit: 08/25/2014    Years since quitting: 4.1  . Smokeless tobacco: Current User    Types: Chew  Substance and Sexual  Activity  . Alcohol use: Yes    Comment: occasionally  . Drug use: No  . Sexual activity: Yes    Birth control/protection: None  Lifestyle  . Physical activity:    Days per week: Not on file    Minutes per session: Not on file  . Stress: Not on file  Relationships  . Social connections:    Talks on phone: Not on file    Gets together: Not on file    Attends religious service: Not on file    Active member of club or organization: Not on file    Attends meetings of clubs or organizations: Not on file    Relationship status: Not on file  Other Topics Concern  . Not on file  Social History Narrative  . Not on file    No past medical history on file.   There are no active problems to display for this patient.   Past Surgical History:  Procedure Laterality Date  . LIGAMENT REPAIR Left 1995   Ankle    Family History        Family Status  Relation Name Status  . Mother  Deceased  . Father  Alive  . Sister  Alive  . Brother  Alive  . Brother  Alive  . Neg Hx  (Not Specified)        His family history includes Cancer in his  mother; Healthy in his brother, brother, and sister; Hypertension in his father. There is no history of Colon cancer, Prostate cancer, or Breast cancer.      No Known Allergies   Current Outpatient Medications:  .  fluticasone (FLONASE) 50 MCG/ACT nasal spray, Place 2 sprays into both nostrils daily., Disp: 16 g, Rfl: 6   Patient Care Team: Virginia Crews, MD as PCP - General (Family Medicine)    Objective:    Vitals: There were no vitals taken for this visit.  There were no vitals filed for this visit.   Physical Exam   Depression Screen PHQ 2/9 Scores 05/12/2017  PHQ - 2 Score 0       Assessment & Plan:     Routine Health Maintenance and Physical Exam  Exercise Activities and Dietary recommendations Goals   None     Immunization History  Administered Date(s) Administered  . Tdap 08/11/2017    Health  Maintenance  Topic Date Due  . INFLUENZA VACCINE  03/26/2018  . TETANUS/TDAP  08/12/2027  . HIV Screening  Completed     Discussed health benefits of physical activity, and encouraged him to engage in regular exercise appropriate for his age and condition.    --------------------------------------------------------------------    Lavon Paganini, MD  [facility]

## 2018-10-26 ENCOUNTER — Ambulatory Visit (INDEPENDENT_AMBULATORY_CARE_PROVIDER_SITE_OTHER): Payer: BLUE CROSS/BLUE SHIELD | Admitting: Family Medicine

## 2018-10-26 ENCOUNTER — Encounter: Payer: Self-pay | Admitting: Family Medicine

## 2018-10-26 VITALS — BP 122/86 | HR 101 | Temp 98.1°F | Resp 16 | Ht 70.5 in | Wt 179.4 lb

## 2018-10-26 DIAGNOSIS — R0989 Other specified symptoms and signs involving the circulatory and respiratory systems: Secondary | ICD-10-CM

## 2018-10-26 DIAGNOSIS — R739 Hyperglycemia, unspecified: Secondary | ICD-10-CM

## 2018-10-26 DIAGNOSIS — E663 Overweight: Secondary | ICD-10-CM | POA: Diagnosis not present

## 2018-10-26 DIAGNOSIS — K219 Gastro-esophageal reflux disease without esophagitis: Secondary | ICD-10-CM | POA: Diagnosis not present

## 2018-10-26 DIAGNOSIS — Z0000 Encounter for general adult medical examination without abnormal findings: Secondary | ICD-10-CM | POA: Diagnosis not present

## 2018-10-26 DIAGNOSIS — R198 Other specified symptoms and signs involving the digestive system and abdomen: Secondary | ICD-10-CM | POA: Insufficient documentation

## 2018-10-26 MED ORDER — OMEPRAZOLE 20 MG PO CPDR: 20.0000 mg | DELAYED_RELEASE_CAPSULE | Freq: Every day | ORAL | 3 refills | Status: DC

## 2018-10-26 NOTE — Assessment & Plan Note (Signed)
See plan above for GERD

## 2018-10-26 NOTE — Patient Instructions (Signed)
Gastroesophageal Reflux Disease, Adult Gastroesophageal reflux (GER) happens when acid from the stomach flows up into the tube that connects the mouth and the stomach (esophagus). Normally, food travels down the esophagus and stays in the stomach to be digested. With GER, food and stomach acid sometimes move back up into the esophagus. You may have a disease called gastroesophageal reflux disease (GERD) if the reflux:  Happens often.  Causes frequent or very bad symptoms.  Causes problems such as damage to the esophagus. When this happens, the esophagus becomes sore and swollen (inflamed). Over time, GERD can make small holes (ulcers) in the lining of the esophagus. What are the causes? This condition is caused by a problem with the muscle between the esophagus and the stomach. When this muscle is weak or not normal, it does not close properly to keep food and acid from coming back up from the stomach. The muscle can be weak because of:  Tobacco use.  Pregnancy.  Having a certain type of hernia (hiatal hernia).  Alcohol use.  Certain foods and drinks, such as coffee, chocolate, onions, and peppermint. What increases the risk? You are more likely to develop this condition if you:  Are overweight.  Have a disease that affects your connective tissue.  Use NSAID medicines. What are the signs or symptoms? Symptoms of this condition include:  Heartburn.  Difficult or painful swallowing.  The feeling of having a lump in the throat.  A bitter taste in the mouth.  Bad breath.  Having a lot of saliva.  Having an upset or bloated stomach.  Belching.  Chest pain. Different conditions can cause chest pain. Make sure you see your doctor if you have chest pain.  Shortness of breath or noisy breathing (wheezing).  Ongoing (chronic) cough or a cough at night.  Wearing away of the surface of teeth (tooth enamel).  Weight loss. How is this treated? Treatment will depend on how  bad your symptoms are. Your doctor may suggest:  Changes to your diet.  Medicine.  Surgery. Follow these instructions at home: Eating and drinking   Follow a diet as told by your doctor. You may need to avoid foods and drinks such as: ? Coffee and tea (with or without caffeine). ? Drinks that contain alcohol. ? Energy drinks and sports drinks. ? Bubbly (carbonated) drinks or sodas. ? Chocolate and cocoa. ? Peppermint and mint flavorings. ? Garlic and onions. ? Horseradish. ? Spicy and acidic foods. These include peppers, chili powder, curry powder, vinegar, hot sauces, and BBQ sauce. ? Citrus fruit juices and citrus fruits, such as oranges, lemons, and limes. ? Tomato-based foods. These include red sauce, chili, salsa, and pizza with red sauce. ? Fried and fatty foods. These include donuts, french fries, potato chips, and high-fat dressings. ? High-fat meats. These include hot dogs, rib eye steak, sausage, ham, and bacon. ? High-fat dairy items, such as whole milk, butter, and cream cheese.  Eat small meals often. Avoid eating large meals.  Avoid drinking large amounts of liquid with your meals.  Avoid eating meals during the 2-3 hours before bedtime.  Avoid lying down right after you eat.  Do not exercise right after you eat. Lifestyle   Do not use any products that contain nicotine or tobacco. These include cigarettes, e-cigarettes, and chewing tobacco. If you need help quitting, ask your doctor.  Try to lower your stress. If you need help doing this, ask your doctor.  If you are overweight, lose an amount  of weight that is healthy for you. Ask your doctor about a safe weight loss goal. General instructions  Pay attention to any changes in your symptoms.  Take over-the-counter and prescription medicines only as told by your doctor. Do not take aspirin, ibuprofen, or other NSAIDs unless your doctor says it is okay.  Wear loose clothes. Do not wear anything tight  around your waist.  Raise (elevate) the head of your bed about 6 inches (15 cm).  Avoid bending over if this makes your symptoms worse.  Keep all follow-up visits as told by your doctor. This is important. Contact a doctor if:  You have new symptoms.  You lose weight and you do not know why.  You have trouble swallowing or it hurts to swallow.  You have wheezing or a cough that keeps happening.  Your symptoms do not get better with treatment.  You have a hoarse voice. Get help right away if:  You have pain in your arms, neck, jaw, teeth, or back.  You feel sweaty, dizzy, or light-headed.  You have chest pain or shortness of breath.  You throw up (vomit) and your throw-up looks like blood or coffee grounds.  You pass out (faint).  Your poop (stool) is bloody or black.  You cannot swallow, drink, or eat. Summary  If a person has gastroesophageal reflux disease (GERD), food and stomach acid move back up into the esophagus and cause symptoms or problems such as damage to the esophagus.  Treatment will depend on how bad your symptoms are.  Follow a diet as told by your doctor.  Take all medicines only as told by your doctor. This information is not intended to replace advice given to you by your health care provider. Make sure you discuss any questions you have with your health care provider. Document Released: 01/29/2008 Document Revised: 02/18/2018 Document Reviewed: 02/18/2018 Elsevier Interactive Patient Education  2019 [place] Years, Male Preventive care refers to lifestyle choices and visits with your health care provider that can promote health and wellness. What does preventive care include?   A yearly physical exam. This is also called an annual well check.  Dental exams once or twice a year.  Routine eye exams. Ask your health care provider how often you should have your eyes checked.  Personal lifestyle choices,  including: ? Daily care of your teeth and gums. ? Regular physical activity. ? Eating a healthy diet. ? Avoiding tobacco and drug use. ? Limiting alcohol use. ? Practicing safe sex. ? Taking low-dose aspirin every day starting at age 18. What happens during an annual well check? The services and screenings done by your health care provider during your annual well check will depend on your age, overall health, lifestyle risk factors, and family history of disease. Counseling Your health care provider may ask you questions about your:  Alcohol use.  Tobacco use.  Drug use.  Emotional well-being.  Home and relationship well-being.  Sexual activity.  Eating habits.  Work and work Statistician. Screening You may have the following tests or measurements:  Height, weight, and BMI.  Blood pressure.  Lipid and cholesterol levels. These may be checked every 5 years, or more frequently if you are over 63 years old.  Skin check.  Lung cancer screening. You may have this screening every year starting at age 53 if you have a 30-pack-year history of smoking and currently smoke or have quit within the past 15 years.  Colorectal cancer  screening. All adults should have this screening starting at age 30 and continuing until age 23. Your health care provider may recommend screening at age 73. You will have tests every 1-10 years, depending on your results and the type of screening test. People at increased risk should start screening at an earlier age. Screening tests may include: ? Guaiac-based fecal occult blood testing. ? Fecal immunochemical test (FIT). ? Stool DNA test. ? Virtual colonoscopy. ? Sigmoidoscopy. During this test, a flexible tube with a tiny camera (sigmoidoscope) is used to examine your rectum and lower colon. The sigmoidoscope is inserted through your anus into your rectum and lower colon. ? Colonoscopy. During this test, a long, thin, flexible tube with a tiny camera  (colonoscope) is used to examine your entire colon and rectum.  Prostate cancer screening. Recommendations will vary depending on your family history and other risks.  Hepatitis C blood test.  Hepatitis B blood test.  Sexually transmitted disease (STD) testing.  Diabetes screening. This is done by checking your blood sugar (glucose) after you have not eaten for a while (fasting). You may have this done every 1-3 years. Discuss your test results, treatment options, and if necessary, the need for more tests with your health care provider. Vaccines Your health care provider may recommend certain vaccines, such as:  Influenza vaccine. This is recommended every year.  Tetanus, diphtheria, and acellular pertussis (Tdap, Td) vaccine. You may need a Td booster every 10 years.  Varicella vaccine. You may need this if you have not been vaccinated.  Zoster vaccine. You may need this after age 83.  Measles, mumps, and rubella (MMR) vaccine. You may need at least one dose of MMR if you were born in 1957 or later. You may also need a second dose.  Pneumococcal 13-valent conjugate (PCV13) vaccine. You may need this if you have certain conditions and have not been vaccinated.  Pneumococcal polysaccharide (PPSV23) vaccine. You may need one or two doses if you smoke cigarettes or if you have certain conditions.  Meningococcal vaccine. You may need this if you have certain conditions.  Hepatitis A vaccine. You may need this if you have certain conditions or if you travel or work in places where you may be exposed to hepatitis A.  Hepatitis B vaccine. You may need this if you have certain conditions or if you travel or work in places where you may be exposed to hepatitis B.  Haemophilus influenzae type b (Hib) vaccine. You may need this if you have certain risk factors. Talk to your health care provider about which screenings and vaccines you need and how often you need them. This information is not  intended to replace advice given to you by your health care provider. Make sure you discuss any questions you have with your health care provider. Document Released: 09/08/2015 Document Revised: 10/02/2017 Document Reviewed: 06/13/2015 Elsevier Interactive Patient Education  2019 [employer].

## 2018-10-26 NOTE — Progress Notes (Deleted)
Patient: Jeffrey Sanchez, Male    DOB: 1970-10-19, 48 y.o.   MRN: [id number] Visit Date: 10/26/2018  Today's Provider: Lavon Paganini, MD   No chief complaint on file.  Subjective:    Annual physical exam Jeffrey Sanchez is a 48 y.o. male who presents today for health maintenance and complete physical. He feels {DESC; WELL/FAIRLY WELL/POORLY:18703}. He reports exercising ***. He reports he is sleeping {DESC; WELL/FAIRLY WELL/POORLY:18703}.  -----------------------------------------------------------------   Review of Systems  Constitutional: Negative.   HENT: Negative.   Eyes: Negative.   Respiratory: Negative.   Cardiovascular: Negative.   Gastrointestinal: Negative.   Endocrine: Negative.   Genitourinary: Negative.   Musculoskeletal: Negative.   Skin: Negative.   Allergic/Immunologic: Negative.   Neurological: Negative.   Hematological: Negative.   Psychiatric/Behavioral: Negative.     Social History He  reports that he quit smoking about 4 years ago. His smoking use included cigarettes. He has a 56.00 pack-year smoking history. His smokeless tobacco use includes chew. He reports current alcohol use. He reports that he does not use drugs. Social History   Socioeconomic History  . Marital status: Single    Spouse name: Not on file  . Number of children: 1  . Years of education: HS  . Highest education level: Not on file  Occupational History    Employer: [employer]  Social Needs  . Financial resource strain: Not on file  . Food insecurity:    Worry: Not on file    Inability: Not on file  . Transportation needs:    Medical: Not on file    Non-medical: Not on file  Tobacco Use  . Smoking status: Former Smoker    Packs/day: 2.00    Years: 28.00    Pack years: 56.00    Types: Cigarettes    Last attempt to quit: 08/25/2014    Years since quitting: 4.1  . Smokeless tobacco: Current User    Types: Chew  Substance and Sexual Activity  . Alcohol  use: Yes    Comment: occasionally  . Drug use: No  . Sexual activity: Yes    Birth control/protection: None  Lifestyle  . Physical activity:    Days per week: Not on file    Minutes per session: Not on file  . Stress: Not on file  Relationships  . Social connections:    Talks on phone: Not on file    Gets together: Not on file    Attends religious service: Not on file    Active member of club or organization: Not on file    Attends meetings of clubs or organizations: Not on file    Relationship status: Not on file  Other Topics Concern  . Not on file  Social History Narrative  . Not on file    There are no active problems to display for this patient.   Past Surgical History:  Procedure Laterality Date  . LIGAMENT REPAIR Left 1995   Ankle    Family History  Family Status  Relation Name Status  . Mother  Deceased  . Father  Alive  . Sister  Alive  . Brother  Alive  . Brother  Alive  . Neg Hx  (Not Specified)   His family history includes Cancer in his mother; Healthy in his brother, brother, and sister; Hypertension in his father.     No Known Allergies  Previous Medications   FLUTICASONE (FLONASE) 50 MCG/ACT NASAL SPRAY  Place 2 sprays into both nostrils daily.    Patient Care Team: Virginia Crews, MD as PCP - General (Family Medicine)      Objective:   Vitals: There were no vitals taken for this visit.   Physical Exam   Depression Screen PHQ 2/9 Scores 05/12/2017  PHQ - 2 Score 0      Assessment & Plan:     Routine Health Maintenance and Physical Exam  Exercise Activities and Dietary recommendations Goals   None     Immunization History  Administered Date(s) Administered  . Tdap 08/11/2017    Health Maintenance  Topic Date Due  . INFLUENZA VACCINE  03/26/2018  . TETANUS/TDAP  08/12/2027  . HIV Screening  Completed     Discussed health benefits of physical activity, and encouraged him to engage in regular exercise  appropriate for his age and condition.    --------------------------------------------------------------------

## 2018-10-26 NOTE — Assessment & Plan Note (Signed)
Suspect that globus sensation is related to untreated GERD We will start omeprazole 20 mg daily If continues to have symptoms despite treatment, will consider referral to GI for possible EGD for further evaluation

## 2018-10-26 NOTE — Assessment & Plan Note (Signed)
Discussed diet and exercise 

## 2018-10-26 NOTE — Progress Notes (Signed)
Patient: Jeffrey Sanchez, Male    DOB: 11-Oct-1970, 48 y.o.   MRN: [id number] Visit Date: 10/26/2018  Today's Provider: Lavon Paganini, MD   Chief Complaint  Patient presents with  . Annual Exam   Subjective:    Annual physical exam Jeffrey Sanchez is a 48 y.o. male who presents today for health maintenance and complete physical. He feels well. He reports he is not actively exercising, but stays active with work duties. He reports he is sleeping well. Patients last reported Tdap 08/11/17, patient has declined flu vaccine.    Feels food gets stuck intermittently when swallowing, especially in lower esophagus.  Never had this previously. +GERD symptoms, but doesn't take any medications -----------------------------------------------------------------   Review of Systems  Constitutional: Negative.   HENT: Positive for sinus pressure. Negative for congestion, dental problem, drooling, ear discharge, ear pain, facial swelling, hearing loss, mouth sores, nosebleeds, postnasal drip, rhinorrhea, sinus pain, sneezing, sore throat, tinnitus, trouble swallowing and voice change.   Eyes: Negative.   Respiratory: Negative.   Cardiovascular: Negative.   Gastrointestinal: Negative.   Endocrine: Negative.   Genitourinary: Negative.   Musculoskeletal: Positive for arthralgias. Negative for back pain, gait problem, joint swelling, myalgias, neck pain and neck stiffness.  Skin: Negative.   Allergic/Immunologic: Positive for environmental allergies. Negative for food allergies and immunocompromised state.  Neurological: Negative.   Hematological: Negative.   Psychiatric/Behavioral: Negative.     Social History He  reports that he quit smoking about 4 years ago. His smoking use included cigarettes. He has a 56.00 pack-year smoking history. His smokeless tobacco use includes chew. He reports current alcohol use. He reports that he does not use drugs. Social History   Socioeconomic History  .  Marital status: Single    Spouse name: Not on file  . Number of children: 1  . Years of education: HS  . Highest education level: Not on file  Occupational History    Employer: [employer]  Social Needs  . Financial resource strain: Not on file  . Food insecurity:    Worry: Not on file    Inability: Not on file  . Transportation needs:    Medical: Not on file    Non-medical: Not on file  Tobacco Use  . Smoking status: Former Smoker    Packs/day: 2.00    Years: 28.00    Pack years: 56.00    Types: Cigarettes    Last attempt to quit: 08/25/2014    Years since quitting: 4.1  . Smokeless tobacco: Current User    Types: Chew  Substance and Sexual Activity  . Alcohol use: Yes    Comment: occasionally  . Drug use: No  . Sexual activity: Yes    Birth control/protection: None  Lifestyle  . Physical activity:    Days per week: Not on file    Minutes per session: Not on file  . Stress: Not on file  Relationships  . Social connections:    Talks on phone: Not on file    Gets together: Not on file    Attends religious service: Not on file    Active member of club or organization: Not on file    Attends meetings of clubs or organizations: Not on file    Relationship status: Not on file  Other Topics Concern  . Not on file  Social History Narrative  . Not on file    There are no active problems to display for this  patient.   Past Surgical History:  Procedure Laterality Date  . LIGAMENT REPAIR Left 1995   Ankle    Family History  Family Status  Relation Name Status  . Mother  Deceased  . Father  Alive  . Sister  Alive  . Brother  Alive  . Brother  Alive  . Neg Hx  (Not Specified)   His family history includes Cancer in his mother; Healthy in his brother, brother, and sister; Hypertension in his father.     No Known Allergies  Previous Medications   FLUTICASONE (FLONASE) 50 MCG/ACT NASAL SPRAY    Place 2 sprays into both nostrils daily.    Patient  Care Team: Virginia Crews, MD as PCP - General (Family Medicine)      Objective:   Vitals: BP 122/86   Pulse (!) 101   Temp 98.1 F (36.7 C) (Oral)   Resp 16   Ht 5' 10.5" (1.791 m)   Wt 179 lb 6.4 oz (81.4 kg)   BMI 25.38 kg/m    Physical Exam Vitals signs reviewed.  Constitutional:      General: He is not in acute distress.    Appearance: Normal appearance. He is well-developed. He is not diaphoretic.  HENT:     Head: Normocephalic and atraumatic.     Right Ear: Tympanic membrane, ear canal and external ear normal.     Left Ear: Tympanic membrane, ear canal and external ear normal.     Nose: Nose normal. No congestion.     Mouth/Throat:     Mouth: Mucous membranes are moist.     Pharynx: Oropharynx is clear. No oropharyngeal exudate.  Eyes:     General: No scleral icterus.    Conjunctiva/sclera: Conjunctivae normal.     Pupils: Pupils are equal, round, and reactive to light.  Neck:     Musculoskeletal: Neck supple.     Thyroid: No thyromegaly.  Cardiovascular:     Rate and Rhythm: Normal rate and regular rhythm.     Pulses: Normal pulses.     Heart sounds: Normal heart sounds. No murmur.  Pulmonary:     Effort: Pulmonary effort is normal. No respiratory distress.     Breath sounds: Normal breath sounds. No wheezing or rales.  Abdominal:     General: Bowel sounds are normal. There is no distension.     Palpations: Abdomen is soft.     Tenderness: There is no abdominal tenderness. There is no guarding or rebound.  Musculoskeletal:        General: No deformity.     Right lower leg: No edema.     Left lower leg: No edema.  Lymphadenopathy:     Cervical: No cervical adenopathy.  Skin:    General: Skin is warm and dry.     Capillary Refill: Capillary refill takes less than 2 seconds.     Findings: No rash.  Neurological:     Mental Status: He is alert and oriented to person, place, and time. Mental status is at baseline.     Gait: Gait normal.    Psychiatric:        Mood and Affect: Mood normal.        Behavior: Behavior normal.        Thought Content: Thought content normal.      Depression Screen PHQ 2/9 Scores 10/26/2018 05/12/2017  PHQ - 2 Score 0 0  PHQ- 9 Score 0 -     Assessment & Plan:  Routine Health Maintenance and Physical Exam  Exercise Activities and Dietary recommendations Goals   None     Immunization History  Administered Date(s) Administered  . Tdap 08/11/2017    Health Maintenance  Topic Date Due  . INFLUENZA VACCINE  03/26/2018  . TETANUS/TDAP  08/12/2027  . HIV Screening  Completed     Discussed health benefits of physical activity, and encouraged him to engage in regular exercise appropriate for his age and condition.    --------------------------------------------------------------------  Problem List Items Addressed This Visit      Digestive   GERD (gastroesophageal reflux disease)    Suspect that globus sensation is related to untreated GERD We will start omeprazole 20 mg daily If continues to have symptoms despite treatment, will consider referral to GI for possible EGD for further evaluation      Relevant Medications   omeprazole (PRILOSEC) 20 MG capsule   Other Relevant Orders   CBC w/Diff/Platelet     Other   Overweight    Discussed diet and exercise      Relevant Orders   Hemoglobin A1c   CBC w/Diff/Platelet   Comprehensive metabolic panel   Lipid panel   Globus sensation    See plan above for GERD      Hyperglycemia    Check A1c      Relevant Orders   Hemoglobin A1c    Other Visit Diagnoses    Encounter for annual physical exam    -  Primary   Relevant Orders   Hemoglobin A1c   CBC w/Diff/Platelet   Comprehensive metabolic panel   Lipid panel       Return in about 1 year (around 10/26/2019) for Physical.   The entirety of the information documented in the History of Present Illness, Review of Systems and Physical Exam were personally  obtained by me. Portions of this information were initially documented by Hebert Soho, CMA and reviewed by me for thoroughness and accuracy.    Virginia Crews, MD, MPH [facility] 10/26/2018 4:55 PM

## 2018-10-26 NOTE — Assessment & Plan Note (Signed)
Check A1c. 

## 2018-10-27 DIAGNOSIS — K219 Gastro-esophageal reflux disease without esophagitis: Secondary | ICD-10-CM | POA: Diagnosis not present

## 2018-10-27 DIAGNOSIS — R739 Hyperglycemia, unspecified: Secondary | ICD-10-CM | POA: Diagnosis not present

## 2018-10-27 DIAGNOSIS — Z0000 Encounter for general adult medical examination without abnormal findings: Secondary | ICD-10-CM | POA: Diagnosis not present

## 2018-10-27 DIAGNOSIS — E663 Overweight: Secondary | ICD-10-CM | POA: Diagnosis not present

## 2018-10-28 LAB — CBC WITH DIFFERENTIAL/PLATELET
BASOS ABS: 0.1 10*3/uL (ref 0.0–0.2)
Basos: 1 %
EOS (ABSOLUTE): 0.2 10*3/uL (ref 0.0–0.4)
Eos: 3 %
HEMATOCRIT: 45.6 % (ref 37.5–51.0)
HEMOGLOBIN: 15.5 g/dL (ref 13.0–17.7)
IMMATURE GRANULOCYTES: 0 %
Immature Grans (Abs): 0 10*3/uL (ref 0.0–0.1)
Lymphocytes Absolute: 1.9 10*3/uL (ref 0.7–3.1)
Lymphs: 37 %
MCH: 28.9 pg (ref 26.6–33.0)
MCHC: 34 g/dL (ref 31.5–35.7)
MCV: 85 fL (ref 79–97)
MONOS ABS: 0.4 10*3/uL (ref 0.1–0.9)
Monocytes: 8 %
NEUTROS ABS: 2.6 10*3/uL (ref 1.4–7.0)
Neutrophils: 51 %
Platelets: 264 10*3/uL (ref 150–450)
RBC: 5.37 x10E6/uL (ref 4.14–5.80)
RDW: 13.2 % (ref 11.6–15.4)
WBC: 5.2 10*3/uL (ref 3.4–10.8)

## 2018-10-28 LAB — COMPREHENSIVE METABOLIC PANEL
ALBUMIN: 4.6 g/dL (ref 4.0–5.0)
ALK PHOS: 58 IU/L (ref 39–117)
ALT: 25 IU/L (ref 0–44)
AST: 16 IU/L (ref 0–40)
Albumin/Globulin Ratio: 2.2 (ref 1.2–2.2)
BUN / CREAT RATIO: 11 (ref 9–20)
BUN: 11 mg/dL (ref 6–24)
Bilirubin Total: 0.7 mg/dL (ref 0.0–1.2)
CALCIUM: 9.2 mg/dL (ref 8.7–10.2)
CO2: 22 mmol/L (ref 20–29)
CREATININE: 1.02 mg/dL (ref 0.76–1.27)
Chloride: 99 mmol/L (ref 96–106)
GFR, EST AFRICAN AMERICAN: 99 mL/min/{1.73_m2} (ref >59)
GFR, EST NON AFRICAN AMERICAN: 86 mL/min/{1.73_m2} (ref >59)
GLOBULIN, TOTAL: 2.1 g/dL (ref 1.5–4.5)
GLUCOSE: 94 mg/dL (ref 65–99)
Potassium: 4.4 mmol/L (ref 3.5–5.2)
Sodium: 137 mmol/L (ref 134–144)
TOTAL PROTEIN: 6.7 g/dL (ref 6.0–8.5)

## 2018-10-28 LAB — HEMOGLOBIN A1C
ESTIMATED AVERAGE GLUCOSE: 114 mg/dL
HEMOGLOBIN A1C: 5.6 % (ref 4.8–5.6)

## 2018-10-28 LAB — LIPID PANEL
CHOL/HDL RATIO: 4 ratio (ref 0.0–5.0)
CHOLESTEROL TOTAL: 170 mg/dL (ref 100–199)
HDL: 43 mg/dL (ref >39)
LDL Calculated: 109 mg/dL — ABNORMAL HIGH (ref 0–99)
Triglycerides: 89 mg/dL (ref 0–149)
VLDL CHOLESTEROL CAL: 18 mg/dL (ref 5–40)

## 2019-04-14 ENCOUNTER — Other Ambulatory Visit: Payer: Self-pay | Admitting: Family Medicine

## 2019-10-07 DIAGNOSIS — Z20822 Contact with and (suspected) exposure to covid-19: Secondary | ICD-10-CM | POA: Diagnosis not present

## 2019-10-28 ENCOUNTER — Other Ambulatory Visit: Payer: Self-pay

## 2019-10-28 ENCOUNTER — Encounter: Payer: Self-pay | Admitting: Family Medicine

## 2019-10-28 ENCOUNTER — Ambulatory Visit (INDEPENDENT_AMBULATORY_CARE_PROVIDER_SITE_OTHER): Payer: BC Managed Care – PPO | Admitting: Family Medicine

## 2019-10-28 VITALS — BP 117/84 | HR 103 | Temp 97.8°F | Resp 16 | Ht 73.0 in | Wt 179.6 lb

## 2019-10-28 DIAGNOSIS — K219 Gastro-esophageal reflux disease without esophagitis: Secondary | ICD-10-CM

## 2019-10-28 DIAGNOSIS — Z23 Encounter for immunization: Secondary | ICD-10-CM

## 2019-10-28 DIAGNOSIS — Z0000 Encounter for general adult medical examination without abnormal findings: Secondary | ICD-10-CM | POA: Diagnosis not present

## 2019-10-28 DIAGNOSIS — R0683 Snoring: Secondary | ICD-10-CM

## 2019-10-28 DIAGNOSIS — E663 Overweight: Secondary | ICD-10-CM | POA: Diagnosis not present

## 2019-10-28 DIAGNOSIS — Z1211 Encounter for screening for malignant neoplasm of colon: Secondary | ICD-10-CM | POA: Diagnosis not present

## 2019-10-28 NOTE — Patient Instructions (Signed)
Preventive Care 41-51 Years Old, Male Preventive care refers to lifestyle choices and visits with your health care provider that can promote health and wellness. This includes:  A yearly physical exam. This is also called an annual well check.  Regular dental and eye exams.  Immunizations.  Screening for certain conditions.  Healthy lifestyle choices, such as eating a healthy diet, getting regular exercise, not using drugs or products that contain nicotine and tobacco, and limiting alcohol use. What can I expect for my preventive care visit? Physical exam Your health care provider will check:  Height and weight. These may be used to calculate body mass index (BMI), which is a measurement that tells if you are at a healthy weight.  Heart rate and blood pressure.  Your skin for abnormal spots. Counseling Your health care provider may ask you questions about:  Alcohol, tobacco, and drug use.  Emotional well-being.  Home and relationship well-being.  Sexual activity.  Eating habits.  Work and work Statistician. What immunizations do I need?  Influenza (flu) vaccine  This is recommended every year. Tetanus, diphtheria, and pertussis (Tdap) vaccine  You may need a Td booster every 10 years. Varicella (chickenpox) vaccine  You may need this vaccine if you have not already been vaccinated. Zoster (shingles) vaccine  You may need this after age 64. Measles, mumps, and rubella (MMR) vaccine  You may need at least one dose of MMR if you were born in 1957 or later. You may also need a second dose. Pneumococcal conjugate (PCV13) vaccine  You may need this if you have certain conditions and were not previously vaccinated. Pneumococcal polysaccharide (PPSV23) vaccine  You may need one or two doses if you smoke cigarettes or if you have certain conditions. Meningococcal conjugate (MenACWY) vaccine  You may need this if you have certain conditions. Hepatitis A  vaccine  You may need this if you have certain conditions or if you travel or work in places where you may be exposed to hepatitis A. Hepatitis B vaccine  You may need this if you have certain conditions or if you travel or work in places where you may be exposed to hepatitis B. Haemophilus influenzae type b (Hib) vaccine  You may need this if you have certain risk factors. Human papillomavirus (HPV) vaccine  If recommended by your health care provider, you may need three doses over 6 months. You may receive vaccines as individual doses or as more than one vaccine together in one shot (combination vaccines). Talk with your health care provider about the risks and benefits of combination vaccines. What tests do I need? Blood tests  Lipid and cholesterol levels. These may be checked every 5 years, or more frequently if you are over 60 years old.  Hepatitis C test.  Hepatitis B test. Screening  Lung cancer screening. You may have this screening every year starting at age 43 if you have a 30-pack-year history of smoking and currently smoke or have quit within the past 15 years.  Prostate cancer screening. Recommendations will vary depending on your family history and other risks.  Colorectal cancer screening. All adults should have this screening starting at age 72 and continuing until age 2. Your health care provider may recommend screening at age 14 if you are at increased risk. You will have tests every 1-10 years, depending on your results and the type of screening test.  Diabetes screening. This is done by checking your blood sugar (glucose) after you have not eaten  for a while (fasting). You may have this done every 1-3 years.  Sexually transmitted disease (STD) testing. Follow these instructions at home: Eating and drinking  Eat a diet that includes fresh fruits and vegetables, whole grains, lean protein, and low-fat dairy products.  Take vitamin and mineral supplements as  recommended by your health care provider.  Do not drink alcohol if your health care provider tells you not to drink.  If you drink alcohol: ? Limit how much you have to 0-2 drinks a day. ? Be aware of how much alcohol is in your drink. In the [place] one drink equals one 12 oz bottle of beer (355 mL), one 5 oz glass of wine (148 mL), or one 1 oz glass of hard liquor (44 mL). Lifestyle  Take daily care of your teeth and gums.  Stay active. Exercise for at least 30 minutes on 5 or more days each week.  Do not use any products that contain nicotine or tobacco, such as cigarettes, e-cigarettes, and chewing tobacco. If you need help quitting, ask your health care provider.  If you are sexually active, practice safe sex. Use a condom or other form of protection to prevent STIs (sexually transmitted infections).  Talk with your health care provider about taking a low-dose aspirin every day starting at age 53. What's next?  Go to your health care provider once a year for a well check visit.  Ask your health care provider how often you should have your eyes and teeth checked.  Stay up to date on all vaccines. This information is not intended to replace advice given to you by your health care provider. Make sure you discuss any questions you have with your health care provider. Document Revised: 08/06/2018 Document Reviewed: 08/06/2018 Elsevier Patient Education  2020 [employer].

## 2019-10-28 NOTE — Progress Notes (Signed)
Patient: Jeffrey Sanchez, Male    DOB: 1970/12/11, 49 y.o.   MRN: [id number] Visit Date: 10/28/2019  Today's Provider: Lavon Paganini, MD   Chief Complaint  Patient presents with  . Annual Exam   Subjective:     Annual physical exam Jeffrey Sanchez is a 49 y.o. male who presents today for health maintenance and complete physical. He feels well. He reports exercising physically active daily at work. He reports he is sleeping fairly well. Patient reports snoring a lot.  Denies morning headaches, daytime drowsiness, nonrestorative sleep.  He will doze off while watching TV in the evenings, however.  -----------------------------------------------------------------   Review of Systems  Constitutional: Negative.   HENT: Negative.   Eyes: Negative.   Respiratory: Negative.   Cardiovascular: Negative.   Gastrointestinal: Negative.   Endocrine: Negative.   Genitourinary: Negative.   Musculoskeletal: Negative.   Skin: Negative.   Allergic/Immunologic: Positive for environmental allergies.  Neurological: Negative.   Hematological: Negative.   Psychiatric/Behavioral: Positive for sleep disturbance.    Social History      He  reports that he quit smoking about 5 years ago. His smoking use included cigarettes. He has a 56.00 pack-year smoking history. His smokeless tobacco use includes chew. He reports current alcohol use. He reports that he does not use drugs.       Social History   Socioeconomic History  . Marital status: Single    Spouse name: Not on file  . Number of children: 1  . Years of education: HS  . Highest education level: Not on file  Occupational History    Employer: [employer]  Tobacco Use  . Smoking status: Former Smoker    Packs/day: 2.00    Years: 28.00    Pack years: 56.00    Types: Cigarettes    Quit date: 08/25/2014    Years since quitting: 5.1  . Smokeless tobacco: Current User    Types: Chew  Substance and Sexual Activity  . Alcohol  use: Yes    Comment: occasionally  . Drug use: No  . Sexual activity: Yes    Birth control/protection: None  Other Topics Concern  . Not on file  Social History Narrative  . Not on file   Social Determinants of Health   Financial Resource Strain:   . Difficulty of Paying Living Expenses: Not on file  Food Insecurity:   . Worried About Charity fundraiser in the Last Year: Not on file  . Ran Out of Food in the Last Year: Not on file  Transportation Needs:   . Lack of Transportation (Medical): Not on file  . Lack of Transportation (Non-Medical): Not on file  Physical Activity:   . Days of Exercise per Week: Not on file  . Minutes of Exercise per Session: Not on file  Stress:   . Feeling of Stress : Not on file  Social Connections:   . Frequency of Communication with Friends and Family: Not on file  . Frequency of Social Gatherings with Friends and Family: Not on file  . Attends Religious Services: Not on file  . Active Member of Clubs or Organizations: Not on file  . Attends Archivist Meetings: Not on file  . Marital Status: Not on file    History reviewed. No pertinent past medical history.   Patient Active Problem List   Diagnosis Date Noted  . GERD (gastroesophageal reflux disease) 10/26/2018  . Overweight 10/26/2018  . Globus  sensation 10/26/2018  . Hyperglycemia 10/26/2018    Past Surgical History:  Procedure Laterality Date  . LIGAMENT REPAIR Left 1995   Ankle    Family History        Family Status  Relation Name Status  . Mother  Deceased  . Father  Alive  . Sister  Alive  . Brother  Alive  . Brother  Alive  . Neg Hx  (Not Specified)        His family history includes Cancer in his mother; Healthy in his brother, brother, and sister; Hypertension in his father. There is no history of Colon cancer, Prostate cancer, or Breast cancer.      No Known Allergies   Current Outpatient Medications:  .  fluticasone (FLONASE) 50 MCG/ACT nasal  spray, Place 2 sprays into both nostrils daily., Disp: 16 g, Rfl: 6 .  omeprazole (PRILOSEC) 20 MG capsule, TAKE 1 CAPSULE BY MOUTH EVERY DAY (Patient taking differently: Take 20 mg by mouth as needed. ), Disp: 30 capsule, Rfl: 3   Patient Care Team: Virginia Crews, MD as PCP - General (Family Medicine)    Objective:    Vitals: BP 117/84 (BP Location: Left Arm, Patient Position: Sitting, Cuff Size: Normal)   Pulse (!) 103   Temp 97.8 F (36.6 C) (Temporal)   Resp 16   Ht 6\' 1"  (1.854 m)   Wt 179 lb 9.6 oz (81.5 kg)   BMI 23.70 kg/m    Vitals:   10/28/19 1508  BP: 117/84  Pulse: (!) 103  Resp: 16  Temp: 97.8 F (36.6 C)  TempSrc: Temporal  Weight: 179 lb 9.6 oz (81.5 kg)  Height: 6\' 1"  (1.854 m)     Physical Exam Vitals reviewed.  Constitutional:      General: He is not in acute distress.    Appearance: Normal appearance. He is well-developed. He is not diaphoretic.  HENT:     Head: Normocephalic and atraumatic.     Right Ear: Tympanic membrane, ear canal and external ear normal.     Left Ear: Tympanic membrane, ear canal and external ear normal.  Eyes:     General: No scleral icterus.    Conjunctiva/sclera: Conjunctivae normal.     Pupils: Pupils are equal, round, and reactive to light.  Neck:     Thyroid: No thyromegaly.  Cardiovascular:     Rate and Rhythm: Normal rate and regular rhythm.     Pulses: Normal pulses.     Heart sounds: Normal heart sounds. No murmur.  Pulmonary:     Effort: Pulmonary effort is normal. No respiratory distress.     Breath sounds: Normal breath sounds. No wheezing or rales.  Abdominal:     General: There is no distension.     Palpations: Abdomen is soft.     Tenderness: There is no abdominal tenderness.  Musculoskeletal:        General: No deformity.     Cervical back: Neck supple.     Right lower leg: No edema.     Left lower leg: No edema.  Lymphadenopathy:     Cervical: No cervical adenopathy.  Skin:    General:  Skin is warm and dry.     Findings: No rash.  Neurological:     Mental Status: He is alert and oriented to person, place, and time. Mental status is at baseline.  Psychiatric:        Mood and Affect: Mood normal.  Behavior: Behavior normal.        Thought Content: Thought content normal.      Depression Screen PHQ 2/9 Scores 10/28/2019 10/26/2018 05/12/2017  PHQ - 2 Score 0 0 0  PHQ- 9 Score 0 0 -       Assessment & Plan:     Routine Health Maintenance and Physical Exam  Exercise Activities and Dietary recommendations Goals   None     Immunization History  Administered Date(s) Administered  . Tdap 08/11/2017    Health Maintenance  Topic Date Due  . INFLUENZA VACCINE  03/27/2019  . COLONOSCOPY  08/26/2019  . TETANUS/TDAP  08/12/2027  . HIV Screening  Completed     Discussed health benefits of physical activity, and encouraged him to engage in regular exercise appropriate for his age and condition.    -------------------------------------------------------------------- Problem List Items Addressed This Visit      Digestive   GERD (gastroesophageal reflux disease)    Continue PPI Well-controlled        Other   Overweight    Discussed importance of healthy weight management Discussed diet and exercise       Relevant Orders   CBC with Differential/Platelet (Completed)   Comprehensive metabolic panel (Completed)   Lipid Panel With LDL/HDL Ratio (Completed)   Snoring    Ongoing problem Does have some tendency to doze off when he is sitting still We will check home sleep study      Relevant Orders   Ambulatory referral to Sleep Studies    Other Visit Diagnoses    Encounter for annual physical exam    -  Primary   Relevant Orders   CBC with Differential/Platelet (Completed)   Comprehensive metabolic panel (Completed)   Lipid Panel With LDL/HDL Ratio (Completed)   Colon cancer screening       Relevant Orders   Ambulatory referral to  Gastroenterology   Need for shingles vaccine       Relevant Orders   Varicella-zoster vaccine IM (Completed)       Return in about 1 year (around 10/27/2020) for CPE. Needs second Shingrix in 2 to 6 months  The entirety of the information documented in the History of Present Illness, Review of Systems and Physical Exam were personally obtained by me. Portions of this information were initially documented by Lynford Humphrey, CMA and reviewed by me for thoroughness and accuracy.    Alger Kerstein, Dionne Bucy, MD MPH [facility]

## 2019-10-29 ENCOUNTER — Telehealth: Payer: Self-pay

## 2019-10-29 DIAGNOSIS — R0683 Snoring: Secondary | ICD-10-CM | POA: Insufficient documentation

## 2019-10-29 LAB — COMPREHENSIVE METABOLIC PANEL
ALT: 35 IU/L (ref 0–44)
AST: 19 IU/L (ref 0–40)
Albumin/Globulin Ratio: 2.1 (ref 1.2–2.2)
Albumin: 4.9 g/dL (ref 4.0–5.0)
Alkaline Phosphatase: 55 IU/L (ref 39–117)
BUN/Creatinine Ratio: 11 (ref 9–20)
BUN: 12 mg/dL (ref 6–24)
Bilirubin Total: 0.7 mg/dL (ref 0.0–1.2)
CO2: 21 mmol/L (ref 20–29)
Calcium: 9.3 mg/dL (ref 8.7–10.2)
Chloride: 99 mmol/L (ref 96–106)
Creatinine, Ser: 1.05 mg/dL (ref 0.76–1.27)
GFR calc Af Amer: 95 mL/min/{1.73_m2} (ref >59)
GFR calc non Af Amer: 82 mL/min/{1.73_m2} (ref >59)
Globulin, Total: 2.3 g/dL (ref 1.5–4.5)
Glucose: 93 mg/dL (ref 65–99)
Potassium: 4.3 mmol/L (ref 3.5–5.2)
Sodium: 138 mmol/L (ref 134–144)
Total Protein: 7.2 g/dL (ref 6.0–8.5)

## 2019-10-29 LAB — CBC WITH DIFFERENTIAL/PLATELET
Basophils Absolute: 0.1 10*3/uL (ref 0.0–0.2)
Basos: 1 %
EOS (ABSOLUTE): 0.3 10*3/uL (ref 0.0–0.4)
Eos: 4 %
Hematocrit: 46.2 % (ref 37.5–51.0)
Hemoglobin: 16.1 g/dL (ref 13.0–17.7)
Immature Grans (Abs): 0 10*3/uL (ref 0.0–0.1)
Immature Granulocytes: 0 %
Lymphocytes Absolute: 2.4 10*3/uL (ref 0.7–3.1)
Lymphs: 34 %
MCH: 29.4 pg (ref 26.6–33.0)
MCHC: 34.8 g/dL (ref 31.5–35.7)
MCV: 85 fL (ref 79–97)
Monocytes Absolute: 0.6 10*3/uL (ref 0.1–0.9)
Monocytes: 9 %
Neutrophils Absolute: 3.7 10*3/uL (ref 1.4–7.0)
Neutrophils: 52 %
Platelets: 257 10*3/uL (ref 150–450)
RBC: 5.47 x10E6/uL (ref 4.14–5.80)
RDW: 13.4 % (ref 11.6–15.4)
WBC: 7.1 10*3/uL (ref 3.4–10.8)

## 2019-10-29 LAB — LIPID PANEL WITH LDL/HDL RATIO
Cholesterol, Total: 220 mg/dL — ABNORMAL HIGH (ref 100–199)
HDL: 41 mg/dL (ref >39)
LDL Chol Calc (NIH): 151 mg/dL — ABNORMAL HIGH (ref 0–99)
LDL/HDL Ratio: 3.7 ratio — ABNORMAL HIGH (ref 0.0–3.6)
Triglycerides: 153 mg/dL — ABNORMAL HIGH (ref 0–149)
VLDL Cholesterol Cal: 28 mg/dL (ref 5–40)

## 2019-10-29 NOTE — Telephone Encounter (Signed)
Pt advised.   Thanks,   -Laura  

## 2019-10-29 NOTE — Assessment & Plan Note (Signed)
Discussed importance of healthy weight management Discussed diet and exercise  

## 2019-10-29 NOTE — Assessment & Plan Note (Signed)
Ongoing problem Does have some tendency to doze off when he is sitting still We will check home sleep study

## 2019-10-29 NOTE — Telephone Encounter (Signed)
-----   Message from Virginia Crews, MD sent at 10/29/2019 12:49 PM EST ----- Normal labs.  Cholesterol is high, but 10-year risk of heart disease and stroke is low at 4.7%.  No need for medication at this time, but I do recommend diet low in saturated fat and regular exercise - 30 min at least 5 times per week

## 2019-10-29 NOTE — Assessment & Plan Note (Signed)
Continue PPI Well controlled 

## 2019-11-11 DIAGNOSIS — R0602 Shortness of breath: Secondary | ICD-10-CM | POA: Diagnosis not present

## 2019-11-11 DIAGNOSIS — G4733 Obstructive sleep apnea (adult) (pediatric): Secondary | ICD-10-CM | POA: Diagnosis not present

## 2019-11-11 LAB — PULMONARY FUNCTION TEST

## 2019-11-12 ENCOUNTER — Telehealth: Payer: Self-pay

## 2019-11-12 ENCOUNTER — Encounter: Payer: Self-pay | Admitting: Family Medicine

## 2019-11-12 ENCOUNTER — Other Ambulatory Visit: Payer: Self-pay

## 2019-11-12 DIAGNOSIS — Z1211 Encounter for screening for malignant neoplasm of colon: Secondary | ICD-10-CM

## 2019-11-12 DIAGNOSIS — R0602 Shortness of breath: Secondary | ICD-10-CM | POA: Diagnosis not present

## 2019-11-12 DIAGNOSIS — G4733 Obstructive sleep apnea (adult) (pediatric): Secondary | ICD-10-CM | POA: Diagnosis not present

## 2019-11-12 NOTE — Telephone Encounter (Signed)
Gastroenterology Pre-Procedure Review  Request Date: Thursday 12/02/19 Requesting Physician: Dr. Vicente Males  PATIENT REVIEW QUESTIONS: The patient responded to the following health history questions as indicated:    1. Are you having any GI issues? no 2. Do you have a personal history of Polyps? no 3. Do you have a family history of Colon Cancer or Polyps? no 4. Diabetes Mellitus? no 5. Joint replacements in the past 12 months?no 6. Major health problems in the past 3 months?no 7. Any artificial heart valves, MVP, or defibrillator?no    MEDICATIONS & ALLERGIES:    Patient reports the following regarding taking any anticoagulation/antiplatelet therapy:   Plavix, Coumadin, Eliquis, Xarelto, Lovenox, Pradaxa, Brilinta, or Effient? no Aspirin? no  Patient confirms/reports the following medications:  Current Outpatient Medications  Medication Sig Dispense Refill  . fluticasone (FLONASE) 50 MCG/ACT nasal spray Place 2 sprays into both nostrils daily. 16 g 6  . omeprazole (PRILOSEC) 20 MG capsule TAKE 1 CAPSULE BY MOUTH EVERY DAY (Patient taking differently: Take 20 mg by mouth as needed. ) 30 capsule 3   No current facility-administered medications for this visit.    Patient confirms/reports the following allergies:  No Known Allergies  No orders of the defined types were placed in this encounter.   AUTHORIZATION INFORMATION Primary Insurance: 1D#: Group #:  Secondary Insurance: 1D#: Group #:  SCHEDULE INFORMATION: Date: 12/02/19 Time: Location:[facility]

## 2019-11-16 ENCOUNTER — Telehealth: Payer: Self-pay | Admitting: Family Medicine

## 2019-11-16 NOTE — Telephone Encounter (Signed)
Patient's sleep study order form was dropped off at front desk. The forms were placed in Dr. B.'s box for review.  Thanks, [employer]

## 2019-11-30 ENCOUNTER — Other Ambulatory Visit: Admission: RE | Admit: 2019-11-30 | Payer: BC Managed Care – PPO | Source: Ambulatory Visit

## 2019-11-30 ENCOUNTER — Telehealth: Payer: Self-pay | Admitting: Gastroenterology

## 2019-11-30 NOTE — Telephone Encounter (Signed)
Patient called & l/m on v/m he needs to r/s his procedure.

## 2019-12-01 ENCOUNTER — Telehealth: Payer: Self-pay | Admitting: Gastroenterology

## 2019-12-01 NOTE — Telephone Encounter (Signed)
Trish called from Endo to let us know she spoke with the patient & he wants to cancel his procedure for 12-02-2019 & r/s. Please call

## 2019-12-02 ENCOUNTER — Ambulatory Visit
Admission: RE | Admit: 2019-12-02 | Payer: BC Managed Care – PPO | Source: Home / Self Care | Admitting: Gastroenterology

## 2019-12-02 ENCOUNTER — Encounter: Admission: RE | Payer: Self-pay | Source: Home / Self Care

## 2019-12-02 SURGERY — COLONOSCOPY WITH PROPOFOL
Anesthesia: General

## 2019-12-02 NOTE — Telephone Encounter (Signed)
Returned patients call to r/s his 4/8 colonoscopy.  LVM for him to call back to reschedule.  Thanks,  [place], [place]

## 2019-12-09 ENCOUNTER — Telehealth: Payer: Self-pay

## 2019-12-09 NOTE — Telephone Encounter (Signed)
Copied from [place] [id number]. Topic: General - Inquiry >> Dec 09, 2019 12:02 PM Greggory Keen D wrote: Reason for CRM: Pt called saying he did a sleep study in his home a couple of weeks ago and has not heard anything back and wants to know if someone from the office will call him.  CB#  [phone]

## 2019-12-09 NOTE — Telephone Encounter (Signed)
I don't see any results in the chart.  See the order form.Marland KitchenMarland Kitchen

## 2019-12-10 NOTE — Telephone Encounter (Signed)
OK. So sleep study showed OSA and we ordered CPAP supplies as recommended based on sleep study

## 2019-12-10 NOTE — Telephone Encounter (Signed)
In media tab I see a Sleep Therapy order form with date 12/02/19. With some CPAP settings.

## 2019-12-10 NOTE — Telephone Encounter (Signed)
LMTCB 12/10/2019.  PEC Please advise pt of sleep study.    Thanks,   -Mickel Baas

## 2019-12-13 NOTE — Telephone Encounter (Signed)
Phone call to pt.  Advised that per Dr. B., the sleep study showed OSA, and that CPAP supplies have been ordered.  Pt. Verb. Understanding.  He stated that the medical supply company has contacted him.  Denied any questions at this time.

## 2019-12-29 ENCOUNTER — Encounter: Payer: Self-pay | Admitting: Family Medicine

## 2019-12-29 ENCOUNTER — Ambulatory Visit: Payer: BC Managed Care – PPO | Admitting: Family Medicine

## 2019-12-29 ENCOUNTER — Other Ambulatory Visit: Payer: Self-pay

## 2019-12-29 DIAGNOSIS — Z23 Encounter for immunization: Secondary | ICD-10-CM | POA: Diagnosis not present

## 2019-12-29 NOTE — Progress Notes (Signed)
Patient here for Shingrix vaccination only.  I did not examine the patient.  I did review his medical history, medications, and allergies and vaccine consent form.  CMA gave vaccination. Patient tolerated well.  Virginia Crews, MD, MPH [facility] 12/29/2019 4:41 PM

## 2020-01-07 ENCOUNTER — Other Ambulatory Visit: Payer: Self-pay | Admitting: Family Medicine

## 2020-09-26 ENCOUNTER — Other Ambulatory Visit: Payer: Self-pay | Admitting: Family Medicine

## 2020-10-27 ENCOUNTER — Encounter: Payer: Self-pay | Admitting: Family Medicine

## 2020-10-31 ENCOUNTER — Encounter: Payer: Self-pay | Admitting: Family Medicine

## 2020-10-31 ENCOUNTER — Ambulatory Visit (INDEPENDENT_AMBULATORY_CARE_PROVIDER_SITE_OTHER): Payer: BC Managed Care – PPO | Admitting: Family Medicine

## 2020-10-31 ENCOUNTER — Other Ambulatory Visit: Payer: Self-pay

## 2020-10-31 VITALS — BP 116/88 | HR 97 | Temp 98.3°F | Ht 73.0 in | Wt 180.0 lb

## 2020-10-31 DIAGNOSIS — Z1159 Encounter for screening for other viral diseases: Secondary | ICD-10-CM | POA: Diagnosis not present

## 2020-10-31 DIAGNOSIS — Z1211 Encounter for screening for malignant neoplasm of colon: Secondary | ICD-10-CM

## 2020-10-31 DIAGNOSIS — Z720 Tobacco use: Secondary | ICD-10-CM

## 2020-10-31 DIAGNOSIS — E782 Mixed hyperlipidemia: Secondary | ICD-10-CM

## 2020-10-31 DIAGNOSIS — K219 Gastro-esophageal reflux disease without esophagitis: Secondary | ICD-10-CM

## 2020-10-31 DIAGNOSIS — Z2821 Immunization not carried out because of patient refusal: Secondary | ICD-10-CM | POA: Diagnosis not present

## 2020-10-31 DIAGNOSIS — Z0000 Encounter for general adult medical examination without abnormal findings: Secondary | ICD-10-CM | POA: Diagnosis not present

## 2020-10-31 MED ORDER — OMEPRAZOLE 20 MG PO CPDR: DELAYED_RELEASE_CAPSULE | ORAL | 3 refills | Status: DC

## 2020-10-31 NOTE — Patient Instructions (Signed)

## 2020-10-31 NOTE — Assessment & Plan Note (Signed)
Uncontrolled Resume PPI Discussed lifestyle changes

## 2020-10-31 NOTE — Progress Notes (Signed)
Complete physical exam   Patient: Jeffrey Sanchez   DOB: 29-Sep-1970   50 y.o. Male  MRN: [id number] Visit Date: 10/31/2020  Today's healthcare provider: Lavon Paganini, MD   Chief Complaint  Patient presents with  . Annual Exam   Subjective    HUNTLEY KNOOP is a 50 y.o. male who presents today for a complete physical exam.  He reports consuming a general diet. The patient has a physically strenuous job, but has no regular exercise apart from work.  He generally feels well. He reports sleeping well. He does have additional problems to discuss today.   HPI  Acid Reflux  Started about 3-4 months ago.  Wakes him up at night.  Pt has used omeprazole in the past with seemed to help.     History reviewed. No pertinent past medical history. Past Surgical History:  Procedure Laterality Date  . LIGAMENT REPAIR Left 1995   Ankle   Social History   Socioeconomic History  . Marital status: Single    Spouse name: Not on file  . Number of children: 1  . Years of education: HS  . Highest education level: Not on file  Occupational History    Employer: [employer]  Tobacco Use  . Smoking status: Former Smoker    Packs/day: 2.00    Years: 28.00    Pack years: 56.00    Types: Cigarettes    Quit date: 08/25/2014    Years since quitting: 6.1  . Smokeless tobacco: Current User    Types: Chew  Vaping Use  . Vaping Use: Never used  Substance and Sexual Activity  . Alcohol use: Yes    Comment: occasionally  . Drug use: No  . Sexual activity: Yes    Birth control/protection: None  Other Topics Concern  . Not on file  Social History Narrative  . Not on file   Social Determinants of Health   Financial Resource Strain: Not on file  Food Insecurity: Not on file  Transportation Needs: Not on file  Physical Activity: Not on file  Stress: Not on file  Social Connections: Not on file  Intimate Partner Violence: Not on file   Family Status  Relation Name Status  .  Mother  Deceased  . Father  Alive  . Sister  Alive  . Brother  Alive  . Brother  Alive  . Neg Hx  (Not Specified)   Family History  Problem Relation Age of Onset  . Cancer Mother        vaginal  . Hypertension Father   . Healthy Sister   . Healthy Brother   . Healthy Brother   . Colon cancer Neg Hx   . Prostate cancer Neg Hx   . Breast cancer Neg Hx    No Known Allergies  Patient Care Team: Virginia Crews, MD as PCP - General (Family Medicine)   Medications: Outpatient Medications Prior to Visit  Medication Sig  . fluticasone (FLONASE) 50 MCG/ACT nasal spray SPRAY 2 SPRAYS INTO EACH NOSTRIL EVERY DAY  . [DISCONTINUED] omeprazole (PRILOSEC) 20 MG capsule TAKE 1 CAPSULE BY MOUTH EVERY DAY (Patient not taking: Reported on 10/31/2020)   No facility-administered medications prior to visit.    Review of Systems  Constitutional: Negative.   HENT: Negative.   Eyes: Negative.   Respiratory: Negative.   Cardiovascular: Negative.   Gastrointestinal: Negative.   Endocrine: Negative.   Genitourinary: Negative.   Musculoskeletal: Negative.   Skin:  Negative.   Allergic/Immunologic: Negative.   Neurological: Negative.   Hematological: Negative.   Psychiatric/Behavioral: Negative.       Objective    BP 116/88 (BP Location: Left Arm, Patient Position: Sitting, Cuff Size: Large)   Pulse 97   Temp 98.3 F (36.8 C) (Oral)   Ht 6\' 1"  (1.854 m)   Wt 180 lb (81.6 kg)   SpO2 99%   BMI 23.75 kg/m    Physical Exam Vitals reviewed.  Constitutional:      General: He is not in acute distress.    Appearance: Normal appearance. He is well-developed. He is not diaphoretic.  HENT:     Head: Normocephalic and atraumatic.     Right Ear: Tympanic membrane, ear canal and external ear normal.     Left Ear: Tympanic membrane, ear canal and external ear normal.  Eyes:     General: No scleral icterus.    Conjunctiva/sclera: Conjunctivae normal.     Pupils: Pupils are equal,  round, and reactive to light.  Neck:     Thyroid: No thyromegaly.  Cardiovascular:     Rate and Rhythm: Normal rate and regular rhythm.     Pulses: Normal pulses.     Heart sounds: Normal heart sounds. No murmur heard.   Pulmonary:     Effort: Pulmonary effort is normal. No respiratory distress.     Breath sounds: Normal breath sounds. No wheezing or rales.  Abdominal:     General: There is no distension.     Palpations: Abdomen is soft.     Tenderness: There is no abdominal tenderness.  Musculoskeletal:        General: No deformity.     Cervical back: Neck supple.     Right lower leg: No edema.     Left lower leg: No edema.  Lymphadenopathy:     Cervical: No cervical adenopathy.  Skin:    General: Skin is warm and dry.     Findings: No rash.  Neurological:     Mental Status: He is alert and oriented to person, place, and time. Mental status is at baseline.     Gait: Gait normal.  Psychiatric:        Mood and Affect: Mood normal.        Behavior: Behavior normal.        Thought Content: Thought content normal.       Last depression screening scores PHQ 2/9 Scores 10/31/2020 10/28/2019 10/26/2018  PHQ - 2 Score 0 0 0  PHQ- 9 Score 0 0 0   Last fall risk screening Fall Risk  10/31/2020  Falls in the past year? 0  Number falls in past yr: 0  Injury with Fall? 0  Risk for fall due to : No Fall Risks  Follow up Falls evaluation completed   Last Audit-C alcohol use screening Alcohol Use Disorder Test (AUDIT) 10/31/2020  1. How often do you have a drink containing alcohol? 3  2. How many drinks containing alcohol do you have on a typical day when you are drinking? 0  3. How often do you have six or more drinks on one occasion? 1  AUDIT-C Score 4  Alcohol Brief Interventions/Follow-up -   A score of 3 or more in women, and 4 or more in men indicates increased risk for alcohol abuse, EXCEPT if all of the points are from question 1   No results found for any visits on  10/31/20.  Assessment & Plan  Routine Health Maintenance and Physical Exam  Exercise Activities and Dietary recommendations Goals   None     Immunization History  Administered Date(s) Administered  . Tdap 08/11/2017  . Zoster Recombinat (Shingrix) 10/28/2019, 12/29/2019    Health Maintenance  Topic Date Due  . Hepatitis C Screening  Never done  . COLONOSCOPY (Pts 45-86yrs Insurance coverage will need to be confirmed)  Never done  . INFLUENZA VACCINE  11/23/2020 (Originally 03/26/2020)  . COVID-19 Vaccine (1) 04/29/2021 (Originally 08/25/1974)  . TETANUS/TDAP  08/12/2027  . HIV Screening  Completed  . HPV VACCINES  Aged Out    Discussed health benefits of physical activity, and encouraged him to engage in regular exercise appropriate for his age and condition.  Problem List Items Addressed This Visit      Digestive   GERD (gastroesophageal reflux disease)    Uncontrolled Resume PPI Discussed lifestyle changes      Relevant Medications   omeprazole (PRILOSEC) 20 MG capsule     Other   COVID-19 vaccination declined   Mixed hyperlipidemia    Reviewed last lipid panel Not currently on a statin Recheck FLP and CMP Discussed diet and exercise       Relevant Orders   Lipid panel   Comprehensive metabolic panel   Chewing tobacco use    Counseled on cessation       Other Visit Diagnoses    Encounter for annual physical exam    -  Primary   Relevant Orders   Hepatitis C Antibody   Lipid panel   Comprehensive metabolic panel   Screening for colon cancer       Relevant Orders   Cologuard   Need for hepatitis C screening test       Relevant Orders   Hepatitis C Antibody       Return in about 1 year (around 10/31/2021) for CPE.     I, Lavon Paganini, MD, have reviewed all documentation for this visit. The documentation on 10/31/20 for the exam, diagnosis, procedures, and orders are all accurate and complete.   [place], Dionne Bucy, MD, MPH [facility]

## 2020-10-31 NOTE — Assessment & Plan Note (Signed)
Reviewed last lipid panel Not currently on a statin Recheck FLP and CMP Discussed diet and exercise  

## 2020-10-31 NOTE — Assessment & Plan Note (Signed)
Counseled on cessation 

## 2020-11-01 DIAGNOSIS — Z1159 Encounter for screening for other viral diseases: Secondary | ICD-10-CM | POA: Diagnosis not present

## 2020-11-01 DIAGNOSIS — Z0000 Encounter for general adult medical examination without abnormal findings: Secondary | ICD-10-CM | POA: Diagnosis not present

## 2020-11-01 DIAGNOSIS — E782 Mixed hyperlipidemia: Secondary | ICD-10-CM | POA: Diagnosis not present

## 2020-11-02 ENCOUNTER — Telehealth: Payer: Self-pay

## 2020-11-02 LAB — LIPID PANEL
Chol/HDL Ratio: 5.1 ratio — ABNORMAL HIGH (ref 0.0–5.0)
Cholesterol, Total: 215 mg/dL — ABNORMAL HIGH (ref 100–199)
HDL: 42 mg/dL (ref >39)
LDL Chol Calc (NIH): 154 mg/dL — ABNORMAL HIGH (ref 0–99)
Triglycerides: 105 mg/dL (ref 0–149)
VLDL Cholesterol Cal: 19 mg/dL (ref 5–40)

## 2020-11-02 LAB — COMPREHENSIVE METABOLIC PANEL
ALT: 34 IU/L (ref 0–44)
AST: 18 IU/L (ref 0–40)
Albumin/Globulin Ratio: 2.2 (ref 1.2–2.2)
Albumin: 4.9 g/dL (ref 3.8–4.9)
Alkaline Phosphatase: 65 IU/L (ref 44–121)
BUN/Creatinine Ratio: 9 (ref 9–20)
BUN: 9 mg/dL (ref 6–24)
Bilirubin Total: 0.7 mg/dL (ref 0.0–1.2)
CO2: 24 mmol/L (ref 20–29)
Calcium: 9.4 mg/dL (ref 8.7–10.2)
Chloride: 100 mmol/L (ref 96–106)
Creatinine, Ser: 1.04 mg/dL (ref 0.76–1.27)
Globulin, Total: 2.2 g/dL (ref 1.5–4.5)
Glucose: 104 mg/dL — ABNORMAL HIGH (ref 65–99)
Potassium: 4.9 mmol/L (ref 3.5–5.2)
Sodium: 139 mmol/L (ref 134–144)
Total Protein: 7.1 g/dL (ref 6.0–8.5)
eGFR: 87 mL/min/{1.73_m2} (ref >59)

## 2020-11-02 LAB — HEPATITIS C ANTIBODY: Hep C Virus Ab: <0.1 s/co ratio (ref 0.0–0.9)

## 2020-11-02 NOTE — Telephone Encounter (Signed)
I called pt and pt verbalized understanding of information below.  

## 2020-11-02 NOTE — Telephone Encounter (Signed)
-----   Message from Virginia Crews, MD sent at 11/02/2020  8:27 AM EST ----- Normal labs, except for high cholesterol.  The 10-year ASCVD (heart disease and stroke) risk score Mikey Bussing DC Jr., et al., 2013) is: 4.2%, which is low.  I recommend diet low in saturated fat and regular exercise - 30 min at least 5 times per week

## 2020-11-21 ENCOUNTER — Other Ambulatory Visit: Payer: Self-pay

## 2020-11-21 ENCOUNTER — Ambulatory Visit: Payer: BC Managed Care – PPO | Admitting: Dermatology

## 2020-11-21 DIAGNOSIS — L820 Inflamed seborrheic keratosis: Secondary | ICD-10-CM

## 2020-11-21 DIAGNOSIS — L719 Rosacea, unspecified: Secondary | ICD-10-CM

## 2020-11-21 DIAGNOSIS — L814 Other melanin hyperpigmentation: Secondary | ICD-10-CM

## 2020-11-21 DIAGNOSIS — D1800 Hemangioma unspecified site: Secondary | ICD-10-CM

## 2020-11-21 DIAGNOSIS — Z1283 Encounter for screening for malignant neoplasm of skin: Secondary | ICD-10-CM | POA: Diagnosis not present

## 2020-11-21 DIAGNOSIS — L578 Other skin changes due to chronic exposure to nonionizing radiation: Secondary | ICD-10-CM | POA: Diagnosis not present

## 2020-11-21 DIAGNOSIS — D225 Melanocytic nevi of trunk: Secondary | ICD-10-CM

## 2020-11-21 DIAGNOSIS — D1801 Hemangioma of skin and subcutaneous tissue: Secondary | ICD-10-CM

## 2020-11-21 DIAGNOSIS — D239 Other benign neoplasm of skin, unspecified: Secondary | ICD-10-CM

## 2020-11-21 DIAGNOSIS — D229 Melanocytic nevi, unspecified: Secondary | ICD-10-CM

## 2020-11-21 DIAGNOSIS — D492 Neoplasm of unspecified behavior of bone, soft tissue, and skin: Secondary | ICD-10-CM

## 2020-11-21 DIAGNOSIS — L821 Other seborrheic keratosis: Secondary | ICD-10-CM

## 2020-11-21 HISTORY — DX: Other benign neoplasm of skin, unspecified: D23.9

## 2020-11-21 NOTE — Patient Instructions (Addendum)
Wound Care Instructions  1. Cleanse wound gently with soap and water once a day then pat dry with clean gauze. Apply a thing coat of Petrolatum (petroleum jelly, "Vaseline") over the wound (unless you have an allergy to this). We recommend that you use a new, sterile tube of Vaseline. Do not pick or remove scabs. Do not remove the yellow or white "healing tissue" from the base of the wound.  2. Cover the wound with fresh, clean, nonstick gauze and secure with paper tape. You may use Band-Aids in place of gauze and tape if the would is small enough, but would recommend trimming much of the tape off as there is often too much. Sometimes Band-Aids can irritate the skin.  3. You should call the office for your biopsy report after 1 week if you have not already been contacted.  4. If you experience any problems, such as abnormal amounts of bleeding, swelling, significant bruising, significant pain, or evidence of infection, please call the office immediately.  5. FOR ADULT SURGERY PATIENTS: If you need something for pain relief you may take 1 extra strength Tylenol (acetaminophen) AND 2 Ibuprofen (200mg  each) together every 4 hours as needed for pain. (do not take these if you are allergic to them or if you have a reason you should not take them.) Typically, you may only need pain medication for 1 to 3 days.     Cryotherapy Aftercare  . Wash gently with soap and water everyday.   Marland Kitchen Apply Vaseline and Band-Aid daily until healed.

## 2020-11-21 NOTE — Progress Notes (Signed)
New Patient Visit  Subjective  Jeffrey Sanchez is a 50 y.o. male who presents for the following: Skin Problem (New pt here to have spots checked on his back, chest,neck, hands ). The patient presents for Upper Body Skin Exam (UBSE) for skin cancer screening and mole check.  The following portions of the chart were reviewed this encounter and updated as appropriate:   Tobacco  Allergies  Meds  Problems  Med Hx  Surg Hx  Fam Hx     Review of Systems:  No other skin or systemic complaints except as noted in HPI or Assessment and Plan.  Objective  Well appearing patient in no apparent distress; mood and affect are within normal limits.  All skin waist up examined.  Objective  back x 2,hands x19, R shoulder x 1 (22): Erythematous keratotic or waxy stuck-on papule or plaque.   Objective  L upper gastric: 0.7 cm irregular brown macule   Objective  Head - Anterior (Face): Telangectasia and erythema  Objective  L medial clavicle: Red papules.   Objective  Left Abdomen: Tan-brown colored symmetric macule   Assessment & Plan  Inflamed seborrheic keratosis (22) back x 2,hands x19, R shoulder x 1 Destruction of lesion - back x 2,hands x19, R shoulder x 1 Complexity: simple   Destruction method: cryotherapy   Informed consent: discussed and consent obtained   Timeout:  patient name, date of birth, surgical site, and procedure verified Lesion destroyed using liquid nitrogen: Yes   Region frozen until ice ball extended beyond lesion: Yes   Outcome: patient tolerated procedure well with no complications   Post-procedure details: wound care instructions given    Neoplasm of skin L epigastric Epidermal / dermal shaving  Lesion diameter (cm):  0.7 Informed consent: discussed and consent obtained   Timeout: patient name, date of birth, surgical site, and procedure verified   Procedure prep:  Patient was prepped and draped in usual sterile fashion Prep type:  Isopropyl  alcohol Anesthesia: the lesion was anesthetized in a standard fashion   Anesthetic:  1% lidocaine w/ epinephrine 1-100,000 buffered w/ 8.4% NaHCO3 Hemostasis achieved with: pressure, aluminum chloride and electrodesiccation   Outcome: patient tolerated procedure well   Post-procedure details: sterile dressing applied and wound care instructions given   Dressing type: bandage and petrolatum    Specimen 1 - Surgical pathology Differential Diagnosis: R/o Dysplastic nevus   Check Margins: No 0.7 cm irregular brown macule  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 It is treatable but not curable. It sometimes affects the eyes (ocular rosacea) as well. It may respond to topical and/or systemic medication and can flare with stress, sun exposure, alcohol, exercise and some foods.  Daily application of broad spectrum spf 30+ sunscreen to face is recommended to reduce flares.   Discussed laser / BBL - pt declines tx.  Hemangioma of skin L medial clavicle Benign-appearing.  Observation.  Call clinic for new or changing moles.  Recommend daily use of broad spectrum spf 30+ sunscreen to sun-exposed areas.    Becker's nevus of abdomen Left Abdomen Benign-appearing.  Observation.  Call clinic for new or changing moles.  Recommend daily use of broad spectrum spf 30+ sunscreen to sun-exposed areas.    Skin cancer screening   Lentigines - Scattered tan macules - Due to sun exposure - Benign-appering, observe - Recommend daily broad spectrum sunscreen  SPF 30+ to sun-exposed areas, reapply every 2 hours as needed. - Call for any changes  Seborrheic Keratoses - Stuck-on, waxy, tan-brown papules and/or plaques  - Benign-appearing - Discussed benign etiology and prognosis. - Observe - Call for any changes  Melanocytic Nevi - Tan-brown and/or  pink-flesh-colored symmetric macules and papules - Benign appearing on exam today - Observation - Call clinic for new or changing moles - Recommend daily use of broad spectrum spf 30+ sunscreen to sun-exposed areas.   Hemangiomas - Red papules - Discussed benign nature - Observe - Call for any changes  Actinic Damage - Chronic condition, secondary to cumulative UV/sun exposure - diffuse scaly erythematous macules with underlying dyspigmentation - Recommend daily broad spectrum sunscreen SPF 30+ to sun-exposed areas, reapply every 2 hours as needed.  - Staying in the shade or wearing long sleeves, sun glasses (UVA+UVB protection) and wide brim hats (4-inch brim around the entire circumference of the hat) are also recommended for sun protection.  - Call for new or changing lesions.  Skin cancer screening performed today.  Return in about 1 year (around 11/21/2021) for TBSE.  IMarye Round, CMA, am acting as scribe for Sarina Ser, MD .  Documentation: I have reviewed the above documentation for accuracy and completeness, and I agree with the above.  Sarina Ser, MD

## 2020-11-22 ENCOUNTER — Encounter: Payer: Self-pay | Admitting: Dermatology

## 2020-11-28 ENCOUNTER — Telehealth: Payer: Self-pay

## 2020-11-28 NOTE — Telephone Encounter (Signed)
LM on VM please return my call  

## 2020-11-28 NOTE — Telephone Encounter (Signed)
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

## 2020-12-04 ENCOUNTER — Telehealth: Payer: Self-pay

## 2020-12-04 NOTE — Telephone Encounter (Signed)
Left message for patient to call office for results/hd 

## 2020-12-04 NOTE — Telephone Encounter (Signed)
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

## 2020-12-04 NOTE — Telephone Encounter (Signed)
Left message for patient to call to office for results/hd

## 2020-12-04 NOTE — Telephone Encounter (Signed)
Discussed biopsy results with pt  °

## 2021-03-05 ENCOUNTER — Other Ambulatory Visit: Payer: Self-pay | Admitting: Family Medicine

## 2021-06-10 DIAGNOSIS — M545 Low back pain, unspecified: Secondary | ICD-10-CM | POA: Diagnosis not present

## 2021-11-02 NOTE — Progress Notes (Deleted)
? ? ? ?Complete physical exam ? ? ?Patient: Jeffrey Sanchez   DOB: November 03, 1970   51 y.o. Male  MRN: [id number] ?Visit Date: 11/05/2021 ? ?Today's healthcare provider: Lavon Paganini, MD  ? ?No chief complaint on file. ? ?Subjective  ?  ?Jeffrey Sanchez is a 51 y.o. male who presents today for a complete physical exam.  ?He reports consuming a {diet types:17450} diet. {Exercise:19826} He generally feels {well/fairly well/poorly:18703}. He reports sleeping {well/fairly well/poorly:18703}. He {does/does not:200015} have additional problems to discuss today.  ?HPI  ? ? ?Past Medical History:  ?Diagnosis Date  ? Dysplastic nevus 11/21/2020  ? left epigastric - moderate  ? ?Past Surgical History:  ?Procedure Laterality Date  ? LIGAMENT REPAIR Left 1995  ? Ankle  ? ?Social History  ? ?Socioeconomic History  ? Marital status: Single  ?  Spouse name: Not on file  ? Number of children: 1  ? Years of education: HS  ? Highest education level: Not on file  ?Occupational History  ?  Employer: [employer]  ?Tobacco Use  ? Smoking status: Former  ?  Packs/day: 2.00  ?  Years: 28.00  ?  Pack years: 56.00  ?  Types: Cigarettes  ?  Quit date: 08/25/2014  ?  Years since quitting: 7.1  ? Smokeless tobacco: Current  ?  Types: Chew  ?Vaping Use  ? Vaping Use: Never used  ?Substance and Sexual Activity  ? Alcohol use: Yes  ?  Comment: occasionally  ? Drug use: No  ? Sexual activity: Yes  ?  Birth control/protection: None  ?Other Topics Concern  ? Not on file  ?Social History Narrative  ? Not on file  ? ?Social Determinants of Health  ? ?Financial Resource Strain: Not on file  ?Food Insecurity: Not on file  ?Transportation Needs: Not on file  ?Physical Activity: Not on file  ?Stress: Not on file  ?Social Connections: Not on file  ?Intimate Partner Violence: Not on file  ? ?Family Status  ?Relation Name Status  ? Mother  Deceased  ? Father  Alive  ? Sister  Alive  ? Brother  Alive  ? Brother  Alive  ? Neg Hx  (Not Specified)  ? ?Family  History  ?Problem Relation Age of Onset  ? Cancer Mother   ?     vaginal  ? Hypertension Father   ? Healthy Sister   ? Healthy Brother   ? Healthy Brother   ? Colon cancer Neg Hx   ? Prostate cancer Neg Hx   ? Breast cancer Neg Hx   ? ?No Known Allergies  ?Patient Care Team: ?Virginia Crews, MD as PCP - General (Family Medicine)  ? ?Medications: ?Outpatient Medications Prior to Visit  ?Medication Sig  ? fluticasone (FLONASE) 50 MCG/ACT nasal spray SPRAY 2 SPRAYS INTO EACH NOSTRIL EVERY DAY (NOT COVERED)  ? omeprazole (PRILOSEC) 20 MG capsule TAKE 1 CAPSULE BY MOUTH EVERY DAY  ? ?No facility-administered medications prior to visit.  ? ? ?Review of Systems ? ?Last CBC ?Lab Results  ?Component Value Date  ? WBC 7.1 10/28/2019  ? HGB 16.1 10/28/2019  ? HCT 46.2 10/28/2019  ? MCV 85 10/28/2019  ? MCH 29.4 10/28/2019  ? RDW 13.4 10/28/2019  ? PLT 257 10/28/2019  ? ?Last metabolic panel ?Lab Results  ?Component Value Date  ? GLUCOSE 104 (H) 11/01/2020  ? NA 139 11/01/2020  ? K 4.9 11/01/2020  ? CL 100 11/01/2020  ? CO2  24 11/01/2020  ? BUN 9 11/01/2020  ? CREATININE 1.04 11/01/2020  ? EGFR 87 11/01/2020  ? CALCIUM 9.4 11/01/2020  ? PROT 7.1 11/01/2020  ? ALBUMIN 4.9 11/01/2020  ? LABGLOB 2.2 11/01/2020  ? AGRATIO 2.2 11/01/2020  ? BILITOT 0.7 11/01/2020  ? ALKPHOS 65 11/01/2020  ? AST 18 11/01/2020  ? ALT 34 11/01/2020  ? ?Last lipids ?Lab Results  ?Component Value Date  ? CHOL 215 (H) 11/01/2020  ? HDL 42 11/01/2020  ? LDLCALC 154 (H) 11/01/2020  ? TRIG 105 11/01/2020  ? CHOLHDL 5.1 (H) 11/01/2020  ? ?Last hemoglobin A1c ?Lab Results  ?Component Value Date  ? HGBA1C 5.6 10/27/2018  ? ?Last thyroid functions ?No results found for: TSH, T3TOTAL, T4TOTAL, THYROIDAB ?  ? Objective  ?  ?There were no vitals taken for this visit. ?BP Readings from Last 3 Encounters:  ?10/31/20 116/88  ?10/28/19 117/84  ?10/26/18 122/86  ? ?Wt Readings from Last 3 Encounters:  ?10/31/20 180 lb (81.6 kg)  ?10/28/19 179 lb 9.6 oz (81.5 kg)   ?10/26/18 179 lb 6.4 oz (81.4 kg)  ? ?  ? ? ?Physical Exam  ?*** ? ?Last depression screening scores ?PHQ 2/9 Scores 10/31/2020 10/28/2019 10/26/2018  ?PHQ - 2 Score 0 0 0  ?PHQ- 9 Score 0 0 0  ? ?Last fall risk screening ?Fall Risk  10/31/2020  ?Falls in the past year? 0  ?Number falls in past yr: 0  ?Injury with Fall? 0  ?Risk for fall due to : No Fall Risks  ?Follow up Falls evaluation completed  ? ?Last Audit-C alcohol use screening ?Alcohol Use Disorder Test (AUDIT) 10/31/2020  ?1. How often do you have a drink containing alcohol? 3  ?2. How many drinks containing alcohol do you have on a typical day when you are drinking? 0  ?3. How often do you have six or more drinks on one occasion? 1  ?AUDIT-C Score 4  ?Alcohol Brief Interventions/Follow-up -  ? ?A score of 3 or more in women, and 4 or more in men indicates increased risk for alcohol abuse, EXCEPT if all of the points are from question 1  ? ?No results found for any visits on 11/05/21. ? Assessment & Plan  ?  ?Routine Health Maintenance and Physical Exam ? ?Exercise Activities and Dietary recommendations ? Goals   ?None ?  ? ? ?Immunization History  ?Administered Date(s) Administered  ? Tdap 08/11/2017  ? Zoster Recombinat (Shingrix) 10/28/2019, 12/29/2019  ? ? ?Health Maintenance  ?Topic Date Due  ? COVID-19 Vaccine (1) Never done  ? COLONOSCOPY (Pts 45-42yrs Insurance coverage will need to be confirmed)  Never done  ? INFLUENZA VACCINE  Never done  ? TETANUS/TDAP  08/12/2027  ? Hepatitis C Screening  Completed  ? HIV Screening  Completed  ? Zoster Vaccines- Shingrix  Completed  ? HPV VACCINES  Aged Out  ? ? ?Discussed health benefits of physical activity, and encouraged him to engage in regular exercise appropriate for his age and condition. ? ?*** ? ?No follow-ups on file.  ?  ? ?{provider attestation***:1} ? ? ?Lavon Paganini, MD  ?[facility] ?[phone] (phone) ?[phone] (fax) ? ?[facility] ?

## 2021-11-03 ENCOUNTER — Other Ambulatory Visit: Payer: Self-pay | Admitting: Family Medicine

## 2021-11-05 ENCOUNTER — Encounter: Payer: BC Managed Care – PPO | Admitting: Family Medicine

## 2021-11-05 DIAGNOSIS — E782 Mixed hyperlipidemia: Secondary | ICD-10-CM

## 2021-11-05 DIAGNOSIS — Z0000 Encounter for general adult medical examination without abnormal findings: Secondary | ICD-10-CM

## 2021-11-05 NOTE — Telephone Encounter (Signed)
Requested medication (s) are due for refill today: Yes ? ?Requested medication (s) are on the active medication list: Yes ? ?Last refill:  10/31/20 ? ?Future visit scheduled: Yes ? ?Notes to clinic:  Prescription has expired. ? ? ? ?Requested Prescriptions  ?Pending Prescriptions Disp Refills  ? omeprazole (PRILOSEC) 20 MG capsule [Pharmacy Med Name: OMEPRAZOLE DR 20 MG CAPSULE] 90 capsule 3  ?  Sig: TAKE 1 CAPSULE BY MOUTH EVERY DAY  ?  ? Gastroenterology: Proton Pump Inhibitors Failed - 11/03/2021  4:25 PM  ?  ?  Failed - Valid encounter within last 12 months  ?  Recent Outpatient Visits   ? ?      ? 1 year ago Encounter for annual physical exam  ? [facility] Jansen, Dionne Bucy, MD  ? 1 year ago Need for shingles vaccine  ? [facility] Bacigalupo, Dionne Bucy, MD  ? 2 years ago Encounter for annual physical exam  ? [facility], Dionne Bucy, MD  ? 3 years ago Encounter for annual physical exam  ? [facility] Br[place], Dionne Bucy, MD  ? 3 years ago Acute non-recurrent maxillary sinusitis  ? [facility] Bacigalupo, Dionne Bucy, MD  ? ?  ?  ?Future Appointments   ? ?        ? Today Bacigalupo, Dionne Bucy, MD [facility]  ? ?  ? ?  ?  ?  ? ?

## 2021-11-21 ENCOUNTER — Ambulatory Visit: Payer: BC Managed Care – PPO | Admitting: Dermatology

## 2021-11-21 DIAGNOSIS — D225 Melanocytic nevi of trunk: Secondary | ICD-10-CM

## 2021-11-21 DIAGNOSIS — L719 Rosacea, unspecified: Secondary | ICD-10-CM

## 2021-11-21 DIAGNOSIS — D229 Melanocytic nevi, unspecified: Secondary | ICD-10-CM

## 2021-11-21 DIAGNOSIS — L820 Inflamed seborrheic keratosis: Secondary | ICD-10-CM

## 2021-11-21 DIAGNOSIS — L578 Other skin changes due to chronic exposure to nonionizing radiation: Secondary | ICD-10-CM

## 2021-11-21 DIAGNOSIS — D1800 Hemangioma unspecified site: Secondary | ICD-10-CM

## 2021-11-21 DIAGNOSIS — L821 Other seborrheic keratosis: Secondary | ICD-10-CM

## 2021-11-21 DIAGNOSIS — R238 Other skin changes: Secondary | ICD-10-CM

## 2021-11-21 DIAGNOSIS — Z1283 Encounter for screening for malignant neoplasm of skin: Secondary | ICD-10-CM

## 2021-11-21 DIAGNOSIS — L814 Other melanin hyperpigmentation: Secondary | ICD-10-CM

## 2021-11-21 DIAGNOSIS — Z86018 Personal history of other benign neoplasm: Secondary | ICD-10-CM

## 2021-11-21 NOTE — Progress Notes (Signed)
? ?Follow-Up Visit ?  ?Subjective  ?Jeffrey Sanchez is a 51 y.o. male who presents for the following: Annual Exam (Mole check ). Hx of Dysplastic nevus ?The patient presents for Total-Body Skin Exam (TBSE) for skin cancer screening and mole check.  The patient has spots, moles and lesions to be evaluated, some may be new or changing and the patient has concerns that these could be cancer.  ? ?The following portions of the chart were reviewed this encounter and updated as appropriate:  ? Tobacco  Allergies  Meds  Problems  Med Hx  Surg Hx  Fam Hx   ?  ?Review of Systems:  No other skin or systemic complaints except as noted in HPI or Assessment and Plan. ? ?Objective  ?Well appearing patient in no apparent distress; mood and affect are within normal limits. ? ?A full examination was performed including scalp, head, eyes, ears, nose, lips, neck, chest, axillae, abdomen, back, buttocks, bilateral upper extremities, bilateral lower extremities, hands, feet, fingers, toes, fingernails, and toenails. All findings within normal limits unless otherwise noted below. ? ?face ?Mid face erythema with telangiectasias ? ?hands x 4 (4) ?Stuck-on, waxy, tan-brown papules ? ?left medial clavicle ?Red papule ?  ?left abdomen ?tan-brown colored symmetric macule ? ?Assessment & Plan  ?Rosacea ?face ? ?Rosacea is a chronic progressive skin condition usually affecting the face of adults, causing redness and/or acne bumps. It is treatable but not curable. It sometimes affects the eyes (ocular rosacea) as well. It may respond to topical and/or systemic medication and can flare with stress, sun exposure, alcohol, exercise and some foods.  Daily application of broad spectrum spf 30+ sunscreen to face is recommended to reduce flares.  ? ?Discussed the treatment option of BBL/laser.  Typically we recommend 1-3 treatment sessions about 5-8 weeks apart for best results.  The patient's condition may require "maintenance treatments" in the  future.  The fee for BBL / laser treatments is $350 per treatment session for the whole face.  A fee can be quoted for other parts of the body. ?Insurance typically does not pay for BBL/laser treatments and therefore the fee is an out-of-pocket cost.  ? ?Inflamed seborrheic keratosis (4) ?hands x 4 ?Reassured benign age-related growth.  Recommend observation.  Discussed cryotherapy if spot(s) become irritated or inflamed.  ?Lesions are irritated.  Patient desires treatment. ?Destruction of lesion - hands x 4 ?Complexity: simple   ?Destruction method: cryotherapy   ?Informed consent: discussed and consent obtained   ?Timeout:  patient name, date of birth, surgical site, and procedure verified ?Lesion destroyed using liquid nitrogen: Yes   ?Region frozen until ice ball extended beyond lesion: Yes   ?Outcome: patient tolerated procedure well with no complications   ?Post-procedure details: wound care instructions given   ? ?[place] ?left medial clavicle ?Benign-appearing.  Observation.  Call clinic for new or changing moles.  Recommend daily use of broad spectrum spf 30+ sunscreen to sun-exposed areas.   ? ?Becker's nevus ?left abdomen ?Benign-appearing.  Observation.  Call clinic for new or changing moles.  Recommend daily use of broad spectrum spf 30+ sunscreen to sun-exposed areas.   ? ?Skin cancer screening ? ?Lentigines ?- Scattered tan macules ?- Due to sun exposure ?- Benign-appearing, observe ?- Recommend daily broad spectrum sunscreen SPF 30+ to sun-exposed areas, reapply every 2 hours as needed. ?- Call for any changes ? ?Seborrheic Keratoses ?- Stuck-on, waxy, tan-brown papules and/or plaques  ?- Benign-appearing ?- Discussed benign etiology and prognosis. ?-  Observe ?- Call for any changes ? ?Melanocytic Nevi ?- Tan-brown and/or pink-flesh-colored symmetric macules and papules ?- Benign appearing on exam today ?- Observation ?- Call clinic for new or changing moles ?- Recommend daily use of broad  spectrum spf 30+ sunscreen to sun-exposed areas.  ? ?Hemangiomas ?- Red papules ?- Discussed benign nature ?- Observe ?- Call for any changes ? ?Actinic Damage ?- Chronic condition, secondary to cumulative UV/sun exposure ?- diffuse scaly erythematous macules with underlying dyspigmentation ?- Recommend daily broad spectrum sunscreen SPF 30+ to sun-exposed areas, reapply every 2 hours as needed.  ?- Staying in the shade or wearing long sleeves, sun glasses (UVA+UVB protection) and wide brim hats (4-inch brim around the entire circumference of the hat) are also recommended for sun protection.  ?- Call for new or changing lesions. ? ?History of Dysplastic Nevi ?Left upper gastric 11/21/2020 ?- No evidence of recurrence today ?- Recommend regular full body skin exams ?- Recommend daily broad spectrum sunscreen SPF 30+ to sun-exposed areas, reapply every 2 hours as needed.  ?- Call if any new or changing lesions are noted between office visits  ? ?Skin cancer screening performed today.  ? ?follow-up 1 year for total-body skin exam for history of dysplastic nevus.. ? ?I, Marye Round, CMA, am acting as scribe for Sarina Ser, MD .  ?Documentation: I have reviewed the above documentation for accuracy and completeness, and I agree with the above. ? ?Sarina Ser, MD ? ?

## 2021-11-21 NOTE — Patient Instructions (Addendum)

## 2021-11-22 ENCOUNTER — Encounter: Payer: Self-pay | Admitting: Dermatology

## 2022-11-27 ENCOUNTER — Ambulatory Visit: Payer: Managed Care, Other (non HMO) | Admitting: Dermatology

## 2022-11-27 VITALS — BP 121/76

## 2022-11-27 DIAGNOSIS — L719 Rosacea, unspecified: Secondary | ICD-10-CM

## 2022-11-27 DIAGNOSIS — R238 Other skin changes: Secondary | ICD-10-CM

## 2022-11-27 DIAGNOSIS — D229 Melanocytic nevi, unspecified: Secondary | ICD-10-CM

## 2022-11-27 DIAGNOSIS — D1801 Hemangioma of skin and subcutaneous tissue: Secondary | ICD-10-CM

## 2022-11-27 DIAGNOSIS — Z86018 Personal history of other benign neoplasm: Secondary | ICD-10-CM

## 2022-11-27 DIAGNOSIS — L821 Other seborrheic keratosis: Secondary | ICD-10-CM | POA: Diagnosis not present

## 2022-11-27 DIAGNOSIS — Z1283 Encounter for screening for malignant neoplasm of skin: Secondary | ICD-10-CM | POA: Diagnosis not present

## 2022-11-27 DIAGNOSIS — L578 Other skin changes due to chronic exposure to nonionizing radiation: Secondary | ICD-10-CM

## 2022-11-27 DIAGNOSIS — L814 Other melanin hyperpigmentation: Secondary | ICD-10-CM | POA: Diagnosis not present

## 2022-11-27 NOTE — Progress Notes (Signed)
Follow-Up Visit   Subjective  Jeffrey Sanchez is a 52 y.o. male who presents for the following: Skin Cancer Screening and Full Body Skin Exam The patient presents for Total-Body Skin Exam (TBSE) for skin cancer screening and mole check. The patient has spots, moles and lesions to be evaluated, some may be new or changing and the patient has concerns that these could be cancer.  The following portions of the chart were reviewed this encounter and updated as appropriate: medications, allergies, medical history  Review of Systems:  No other skin or systemic complaints except as noted in HPI or Assessment and Plan.  Objective  Well appearing patient in no apparent distress; mood and affect are within normal limits.  A full examination was performed including scalp, head, eyes, ears, nose, lips, neck, chest, axillae, abdomen, back, buttocks, bilateral upper extremities, bilateral lower extremities, hands, feet, fingers, toes, fingernails, and toenails. All findings within normal limits unless otherwise noted below.   Relevant physical exam findings are noted in the Assessment and Plan.   Assessment & Plan   LENTIGINES, SEBORRHEIC KERATOSES, HEMANGIOMAS - Benign normal skin lesions - Benign-appearing - Call for any changes  MELANOCYTIC NEVI - Tan-brown and/or pink-flesh-colored symmetric macules and papules - Benign appearing on exam today - Observation - Call clinic for new or changing moles - Recommend daily use of broad spectrum spf 30+ sunscreen to sun-exposed areas.   Congenital nevus / Becker's Nevus Exam: tan brown colored diffuse macule, L abdomen Treatment Plan: Benign-appearing.  Observation.  Call clinic for new or changing lesions.  Recommend daily use of broad spectrum spf 30+ sunscreen to sun-exposed areas.     ACTINIC DAMAGE - Chronic condition, secondary to cumulative UV/sun exposure - diffuse scaly erythematous macules with underlying dyspigmentation - Recommend  daily broad spectrum sunscreen SPF 30+ to sun-exposed areas, reapply every 2 hours as needed.  - Staying in the shade or wearing long sleeves, sun glasses (UVA+UVB protection) and wide brim hats (4-inch brim around the entire circumference of the hat) are also recommended for sun protection.  - Call for new or changing lesions.  SKIN CANCER SCREENING PERFORMED TODAY.  [place] L medial clavicle, L neck Exam: purple paps Treatment Plan Benign-appearing.  Observation.  Call clinic for new or changing moles.  Recommend daily use of broad spectrum spf 30+ sunscreen to sun-exposed areas.    HISTORY OF DYSPLASTIC NEVI - No evidence of recurrence today - Recommend regular full body skin exams - Recommend daily broad spectrum sunscreen SPF 30+ to sun-exposed areas, reapply every 2 hours as needed.  - Call if any new or changing lesions are noted between office visits   - L epigastric  ROSACEA Exam: face with erythema and telangiectasias Rosacea is a chronic progressive skin condition usually affecting the face of adults, causing redness and/or acne bumps. It is treatable but not curable. It sometimes affects the eyes (ocular rosacea) as well. It may respond to topical and/or systemic medication and can flare with stress, sun exposure, alcohol, exercise, topical steroids (including hydrocortisone/cortisone 10) and some foods.  Daily application of broad spectrum spf 30+ sunscreen to face is recommended to reduce flares. Treatment Plan Counseling for BBL / IPL / Laser and Coordination of Care Discussed the treatment option of Broad Band Light (BBL) /Intense Pulsed Light (IPL)/ Laser for skin discoloration, including brown spots and redness.  Typically we recommend at least 1-3 treatment sessions about 5-8 weeks apart for best results.  Cannot have tanned  skin when BBL performed, and regular use of sunscreen is advised after the procedure to help maintain results. The patient's condition may also  require "maintenance treatments" in the future.  The fee for BBL / laser treatments is $350 per treatment session for the whole face.  A fee can be quoted for other parts of the body.  Insurance typically does not pay for BBL/laser treatments and therefore the fee is an out-of-pocket cost.   Return in about 1 year (around 11/27/2023) for TBSE, Hx of Dysplastic nevi.  I, Ardis Rowan, RMA, am acting as scribe for Armida Sans, MD .  Documentation: I have reviewed the above documentation for accuracy and completeness, and I agree with the above.  Armida Sans, MD

## 2022-11-27 NOTE — Patient Instructions (Signed)
Due to recent changes in healthcare laws, you may see results of your pathology and/or laboratory studies on MyChart before the doctors have had a chance to review them. We understand that in some cases there may be results that are confusing or concerning to you. Please understand that not all results are received at the same time and often the doctors may need to interpret multiple results in order to provide you with the best plan of care or course of treatment. Therefore, we ask that you please give us 2 business days to thoroughly review all your results before contacting the office for clarification. Should we see a critical lab result, you will be contacted sooner.   If You Need Anything After Your Visit  If you have any questions or concerns for your doctor, please call our main line at [phone] and press option 4 to reach your doctor's medical assistant. If no one answers, please leave a voicemail as directed and we will return your call as soon as possible. Messages left after 4 pm will be answered the following business day.   You may also send us a message via MyChart. We typically respond to MyChart messages within 1-2 business days.  For prescription refills, please ask your pharmacy to contact our office. Our fax number is [phone].  If you have an urgent issue when the clinic is closed that cannot wait until the next business day, you can page your doctor at the number below.    Please note that while we do our best to be available for urgent issues outside of office hours, we are not available 24/7.   If you have an urgent issue and are unable to reach us, you may choose to seek medical care at your doctor's office, retail clinic, urgent care center, or emergency room.  If you have a medical emergency, please immediately call 911 or go to the emergency department.  Pager Numbers  - Dr. Kowalski: [phone]  - Dr. Moye: [phone]  - Dr. Stewart:  [phone]  In the event of inclement weather, please call our main line at [phone] for an update on the status of any delays or closures.  Dermatology Medication Tips: Please keep the boxes that topical medications come in in order to help keep track of the instructions about where and how to use these. Pharmacies typically print the medication instructions only on the boxes and not directly on the medication tubes.   If your medication is too expensive, please contact our office at [phone] option 4 or send us a message through MyChart.   We are unable to tell what your co-pay for medications will be in advance as this is different depending on your insurance coverage. However, we may be able to find a substitute medication at lower cost or fill out paperwork to get insurance to cover a needed medication.   If a prior authorization is required to get your medication covered by your insurance company, please allow us 1-2 business days to complete this process.  Drug prices often vary depending on where the prescription is filled and some pharmacies may offer cheaper prices.  The website www.goodrx.com contains coupons for medications through different pharmacies. The prices here do not account for what the cost may be with help from insurance (it may be cheaper with your insurance), but the website can give you the price if you did not use any insurance.  - You can print the associated coupon and take it with   your prescription to the pharmacy.  - You may also stop by our office during regular business hours and pick up a GoodRx coupon card.  - If you need your prescription sent electronically to a different pharmacy, notify our office through [facility] MyChart or by phone at [phone] option 4.     Si Usted Necesita Algo Despus de Su Visita  Tambin puede enviarnos un mensaje a travs de MyChart. Por lo general respondemos a los mensajes de MyChart en el transcurso de 1 a 2  das hbiles.  Para renovar recetas, por favor pida a su farmacia que se ponga en contacto con nuestra oficina. Nuestro nmero de fax es el [phone].  Si tiene un asunto urgente cuando la clnica est cerrada y que no puede esperar hasta el siguiente da hbil, puede llamar/localizar a su doctor(a) al nmero que aparece a continuacin.   Por favor, tenga en cuenta que aunque hacemos todo lo posible para estar disponibles para asuntos urgentes fuera del horario de oficina, no estamos disponibles las 24 horas del da, los 7 das de la semana.   Si tiene un problema urgente y no puede comunicarse con nosotros, puede optar por buscar atencin mdica  en el consultorio de su doctor(a), en una clnica privada, en un centro de atencin urgente o en una sala de emergencias.  Si tiene una emergencia mdica, por favor llame inmediatamente al 911 o vaya a la sala de emergencias.  Nmeros de bper  - Dr. Kowalski: [phone]  - Dra. Moye: [phone]  - Dra. Stewart: [phone]  En caso de inclemencias del tiempo, por favor llame a nuestra lnea principal al [phone] para una actualizacin sobre el estado de cualquier retraso o cierre.  Consejos para la medicacin en dermatologa: Por favor, guarde las cajas en las que vienen los medicamentos de uso tpico para ayudarle a seguir las instrucciones sobre dnde y cmo usarlos. Las farmacias generalmente imprimen las instrucciones del medicamento slo en las cajas y no directamente en los tubos del medicamento.   Si su medicamento es muy caro, por favor, pngase en contacto con nuestra oficina llamando al [phone] y presione la opcin 4 o envenos un mensaje a travs de MyChart.   No podemos decirle cul ser su copago por los medicamentos por adelantado ya que esto es diferente dependiendo de la cobertura de su seguro. Sin embargo, es posible que podamos encontrar un medicamento sustituto a menor costo o llenar un formulario para que el  seguro cubra el medicamento que se considera necesario.   Si se requiere una autorizacin previa para que su compaa de seguros cubra su medicamento, por favor permtanos de 1 a 2 das hbiles para completar este proceso.  Los precios de los medicamentos varan con frecuencia dependiendo del lugar de dnde se surte la receta y alguna farmacias pueden ofrecer precios ms baratos.  El sitio web www.goodrx.com tiene cupones para medicamentos de diferentes farmacias. Los precios aqu no tienen en cuenta lo que podra costar con la ayuda del seguro (puede ser ms barato con su seguro), pero el sitio web puede darle el precio si no utiliz ningn seguro.  - Puede imprimir el cupn correspondiente y llevarlo con su receta a la farmacia.  - Tambin puede pasar por nuestra oficina durante el horario de atencin regular y recoger una tarjeta de cupones de GoodRx.  - Si necesita que su receta se enve electrnicamente a una farmacia diferente, informe a nuestra oficina a travs de MyChart de    o por telfono llamando al [phone] y presione la opcin 4.  

## 2022-11-29 ENCOUNTER — Encounter: Payer: Self-pay | Admitting: Dermatology

## 2023-11-27 ENCOUNTER — Ambulatory Visit: Payer: Self-pay | Admitting: Dermatology

## 2023-12-09 ENCOUNTER — Ambulatory Visit: Payer: Self-pay | Admitting: Dermatology

## 2023-12-09 ENCOUNTER — Encounter: Payer: Self-pay | Admitting: Dermatology

## 2023-12-09 DIAGNOSIS — L729 Follicular cyst of the skin and subcutaneous tissue, unspecified: Secondary | ICD-10-CM

## 2023-12-09 DIAGNOSIS — Z86018 Personal history of other benign neoplasm: Secondary | ICD-10-CM

## 2023-12-09 DIAGNOSIS — D225 Melanocytic nevi of trunk: Secondary | ICD-10-CM

## 2023-12-09 DIAGNOSIS — L718 Other rosacea: Secondary | ICD-10-CM

## 2023-12-09 DIAGNOSIS — B079 Viral wart, unspecified: Secondary | ICD-10-CM

## 2023-12-09 DIAGNOSIS — L723 Sebaceous cyst: Secondary | ICD-10-CM

## 2023-12-09 DIAGNOSIS — B070 Plantar wart: Secondary | ICD-10-CM

## 2023-12-09 DIAGNOSIS — R238 Other skin changes: Secondary | ICD-10-CM

## 2023-12-09 DIAGNOSIS — L719 Rosacea, unspecified: Secondary | ICD-10-CM

## 2023-12-09 DIAGNOSIS — D2239 Melanocytic nevi of other parts of face: Secondary | ICD-10-CM

## 2023-12-09 DIAGNOSIS — Z1283 Encounter for screening for malignant neoplasm of skin: Secondary | ICD-10-CM | POA: Diagnosis not present

## 2023-12-09 DIAGNOSIS — Z79899 Other long term (current) drug therapy: Secondary | ICD-10-CM

## 2023-12-09 DIAGNOSIS — Z7189 Other specified counseling: Secondary | ICD-10-CM

## 2023-12-09 DIAGNOSIS — D229 Melanocytic nevi, unspecified: Secondary | ICD-10-CM

## 2023-12-09 DIAGNOSIS — L821 Other seborrheic keratosis: Secondary | ICD-10-CM

## 2023-12-09 DIAGNOSIS — L814 Other melanin hyperpigmentation: Secondary | ICD-10-CM

## 2023-12-09 DIAGNOSIS — W908XXA Exposure to other nonionizing radiation, initial encounter: Secondary | ICD-10-CM

## 2023-12-09 DIAGNOSIS — L578 Other skin changes due to chronic exposure to nonionizing radiation: Secondary | ICD-10-CM

## 2023-12-09 MED ORDER — DOXYCYCLINE MONOHYDRATE 100 MG PO CAPS: ORAL_CAPSULE | ORAL | 0 refills | Status: AC

## 2023-12-09 NOTE — Progress Notes (Signed)
 Follow-Up Visit   Subjective  Jeffrey Sanchez is a 53 y.o. male who presents for the following: Skin Cancer Screening and Full Body Skin Exam Noticed some irritated spots 5 at left foot and 1 at right foot.  Irritated spot at right lower back  The patient presents for Total-Body Skin Exam (TBSE) for skin cancer screening and mole check. The patient has spots, moles and lesions to be evaluated, some may be new or changing and the patient may have concern these could be cancer.  The following portions of the chart were reviewed this encounter and updated as appropriate: medications, allergies, medical history  Review of Systems:  No other skin or systemic complaints except as noted in HPI or Assessment and Plan.  Objective  Well appearing patient in no apparent distress; mood and affect are within normal limits.  A full examination was performed including scalp, head, eyes, ears, nose, lips, neck, chest, axillae, abdomen, back, buttocks, bilateral upper extremities, bilateral lower extremities, hands, feet, fingers, toes, fingernails, and toenails. All findings within normal limits unless otherwise noted below.   Relevant physical exam findings are noted in the Assessment and Plan.        left anterior lateral sole x 3, base of left little toe x 1,left medial heel  x 1,right anterior sole x 4 (9) Verrucous papules -- Discussed viral etiology and contagion.   3 at left anterior lateral sole 1 base of left little toe 1 left medial heel  4 right anterior sole   Assessment & Plan   SKIN CANCER SCREENING PERFORMED TODAY.  ACTINIC DAMAGE - Chronic condition, secondary to cumulative UV/sun exposure - diffuse scaly erythematous macules with underlying dyspigmentation - Recommend daily broad spectrum sunscreen SPF 30+ to sun-exposed areas, reapply every 2 hours as needed.  - Staying in the shade or wearing long sleeves, sun glasses (UVA+UVB protection) and wide brim hats (4-inch brim  around the entire circumference of the hat) are also recommended for sun protection.  - Call for new or changing lesions.  LENTIGINES, SEBORRHEIC KERATOSES, HEMANGIOMAS - Benign normal skin lesions - Benign-appearing - Call for any changes  MELANOCYTIC NEVI - 0.5 cm flesh colored macule at left lateral nose infraorbital see photos  - Tan-brown and/or pink-flesh-colored symmetric macules and papules - Benign appearing on exam today - Observation - Call clinic for new or changing moles - Recommend daily use of broad spectrum spf 30+ sunscreen to sun-exposed areas.   Inflamed Cyst at Right low back  Exam: 1.5 cm red plaque  Flared but now improving. Treatment Plan: Benign. Observe Start Doxycycline 100 mg capsule - take 1 po qd for 30 days.   Doxycycline should be taken with food to prevent nausea. Do not lay down for 30 minutes after taking. Be cautious with sun exposure and use good sun protection while on this medication. Pregnant women should not take this medication.   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  Observation.  Call clinic for new or changing lesions.  Recommend daily use of broad spectrum spf 30+ sunscreen to sun-exposed areas.    [place] L medial clavicle, L neck Exam: purple paps Treatment Plan Benign-appearing.  Observation.  Call clinic for new or changing moles.  Recommend daily use of broad spectrum spf 30+ sunscreen to sun-exposed areas.     HISTORY OF DYSPLASTIC  NEVI - No evidence of recurrence today - Recommend regular full body skin exams - Recommend daily broad spectrum sunscreen SPF 30+ to sun-exposed areas, reapply every 2 hours as needed.  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 Condition is symptomatic / bothersome to patient. Not to goal. Exam: face with erythema and telangiectasias Rosacea is a chronic progressive skin condition usually affecting the face of adults, causing redness and/or acne bumps. It is treatable but not curable. It sometimes affects the eyes (ocular rosacea) as well. It may respond to topical and/or systemic medication and can flare with stress, sun exposure, alcohol, exercise, topical steroids (including hydrocortisone/cortisone 10) and some foods.  Daily application of broad spectrum spf 30+ sunscreen to face is recommended to reduce flares. Treatment Plan  Discussed low dose of doxycycline   Patient is not bothered and declines treatment .  Advised he may discuss with his Ophthalmologist too.  Counseling for BBL / IPL / Laser and Coordination of Care Discussed the treatment option of Broad Band Light (BBL) /Intense Pulsed Light (IPL)/ Laser for skin discoloration, including brown spots and redness.  Typically we recommend at least 1-3 treatment sessions about 5-8 weeks apart for best results.  Cannot have tanned skin when BBL performed, and regular use of sunscreen is advised after the procedure to help maintain results. The patient's condition may also require "maintenance treatments" in the future.  The fee for BBL / laser treatments is $350 per treatment session for the whole face.  A fee can be quoted for other parts of the body.  Insurance typically does not pay for BBL/laser treatments and therefore the fee is an out-of-pocket cost.  INFLAMED EPIDERMOID CYST OF SKIN   Related Medications doxycycline (MONODOX) 100 MG capsule Take 1 capsule by mouth daily with evening meal and drink VIRAL WARTS, UNSPECIFIED TYPE (9) left anterior lateral sole x 3, base of left little toe x 1,left medial heel  x 1,right anterior sole x 4 (9) Viral Wart (HPV) Counseling  Discussed viral /  HPV (Human Papilloma Virus) etiology and risk of spread /infectivity to other areas of body as well as to other people.  Multiple treatments and methods may be required to clear warts and it is possible treatment may not be successful.  Treatment risks include discoloration; scarring and there is still potential for wart recurrence.  Patient treated with Ln2 and Cantharidin Plus   Prescribed [facility] Wart Paste  Start prescription 5-fluorouracil/salicylic acid wart paste from [facility] nightly cover with tape or bandage. Wash off next morning and cover with duct tape. Reviewed risk of irritation and risk scarring if applied to normal skin. If irritation develops, stop medication for a few days until area calm, then restart a very small amount just to the wart. This medication cannot be used by pregnant women. Patient advised they will receive an email from the [facility] pharmacy and can purchase the medication online through a link in the email. Destruction of lesion - left anterior lateral sole x 3, base of left little toe x 1,left medial heel  x 1,right anterior sole x 4 (9) Complexity: simple   Destruction method: cryotherapy   Informed consent: discussed and consent obtained   Timeout:  patient name, date of birth, surgical site, and procedure  verified Lesion destroyed using liquid nitrogen: Yes   Region frozen until ice ball extended beyond lesion: Yes   Outcome: patient tolerated procedure well with no complications   Post-procedure details: wound care instructions given    Destruction of lesion - left anterior lateral sole x 3, base of left little toe x 1,left medial heel  x 1,right anterior sole x 4 (9)  Destruction method: chemical removal   Informed consent: discussed and consent obtained   Timeout:  patient name, date of birth, surgical site, and procedure verified Chemical destruction method: cantharidin   Chemical destruction method comment:  Plus Application  time:  4 hours Procedure instructions: patient instructed to wash and dry area   Outcome: patient tolerated procedure well with no complications   Post-procedure details: wound care instructions given   Additional details:  Cantharidin Plus is a blistering agent that comes from a beetle.  It needs to be washed off in about 4-6 hours after application.  Although it is painless when applied in office, it may cause symptoms of mild pain and burning several hours later.  Treated areas will swell and turn red, and blisters may form.  Vaseline and a bandaid may be applied until wound has healed.  Once healed, the skin may remain temporarily discolored.  It can take weeks to months for pigmentation to return to normal.  Advised to wash off with soap and water in 4-6 hours or sooner if it becomes tender before then.  Return in about 1 year (around 12/08/2024) for TBSE.  IRandee Busing, CMA, am acting as scribe for Celine Collard, MD.   Documentation: I have reviewed the above documentation for accuracy and completeness, and I agree with the above.  Celine Collard, MD

## 2023-12-09 NOTE — Patient Instructions (Addendum)
 Viral Wart (HPV) Counseling  Discussed viral / HPV (Human Papilloma Virus) etiology and risk of spread /infectivity to other areas of body as well as to other people.  Multiple treatments and methods may be required to clear warts and it is possible treatment may not be successful.  Treatment risks include discoloration; scarring and there is still potential for wart recurrence.  Cantharidin Plus is a blistering agent that comes from a beetle.  It needs to be washed off in about 4 hours after application.  Although it is painless when applied in office, it may cause symptoms of mild pain and burning several hours later.  Treated areas will swell and turn red, and blisters may form.  Vaseline and a bandaid may be applied until wound has healed.  Once healed, the skin may remain temporarily discolored.  It can take weeks to months for pigmentation to return to normal.  Advised to wash off with soap and water in 4 hours or sooner if it becomes tender before then.  Cryotherapy Aftercare  Wash gently with soap and water everyday.   Apply Vaseline and Band-Aid daily until healed.    Start prescription 5-fluorouracil/salicylic acid wart paste from [facility] nightly cover with bandage or tape  Clean medication off in morning and apply duct tape over warts.  Reviewed risk of irritation and risk scarring if applied to normal skin. If irritation develops, stop medication for a few days until area calm, then restart a very small amount just to the wart. This medication cannot be used by pregnant women. Patient advised they will receive an email from the [facility] pharmacy and can purchase the medication online through a link in the email.  Instructions for [facility] Medications  One or more of your medications was sent to the [facility] mail order compounding pharmacy. You will receive an email from them and can purchase the medicine through that link. It will then be mailed to your home  at the address you confirmed. If for any reason you do not receive an email from them, please check your spam folder. If you still do not find the email, please let us know. [facility] phone number is [phone].    Melanoma ABCDEs  Melanoma is the most dangerous type of skin cancer, and is the leading cause of death from skin disease.  You are more likely to develop melanoma if you: Have light-colored skin, light-colored eyes, or red or blond hair Spend a lot of time in the sun Tan regularly, either outdoors or in a tanning bed Have had blistering sunburns, especially during childhood Have a close family member who has had a melanoma Have atypical moles or large birthmarks  Early detection of melanoma is key since treatment is typically straightforward and cure rates are extremely high if we catch it early.   The first sign of melanoma is often a change in a mole or a new dark spot.  The ABCDE system is a way of remembering the signs of melanoma.  A for asymmetry:  The two halves do not match. B for border:  The edges of the growth are irregular. C for color:  A mixture of colors are present instead of an even brown color. D for diameter:  Melanomas are usually (but not always) greater than 6mm - the size of a pencil eraser. E for evolution:  The spot keeps changing in size, shape, and color.  Please check your skin once per month between visits.  You can use a small mirror in front and a large mirror behind you to keep an eye on the back side or your body.   If you see any new or changing lesions before your next follow-up, please call to schedule a visit.  Please continue daily skin protection including broad spectrum sunscreen SPF 30+ to sun-exposed areas, reapplying every 2 hours as needed when you're outdoors.   Staying in the shade or wearing long sleeves, sun glasses (UVA+UVB protection) and wide brim hats (4-inch brim around the entire circumference of the hat) are also  recommended for sun protection.    Due to recent changes in healthcare laws, you may see results of your pathology and/or laboratory studies on MyChart before the doctors have had a chance to review them. We understand that in some cases there may be results that are confusing or concerning to you. Please understand that not all results are received at the same time and often the doctors may need to interpret multiple results in order to provide you with the best plan of care or course of treatment. Therefore, we ask that you please give [place] 2 business days to thoroughly review all your results before contacting the office for clarification. Should we see a critical lab result, you will be contacted sooner.   If You Need Anything After Your Visit  If you have any questions or concerns for your doctor, please call our main line at [phone] and press option 4 to reach your doctor's medical assistant. If no one answers, please leave a voicemail as directed and we will return your call as soon as possible. Messages left after 4 pm will be answered the following business day.   You may also send [place] a message via MyChart. We typically respond to MyChart messages within 1-2 business days.  For prescription refills, please ask your pharmacy to contact our office. Our fax number is [phone].  If you have an urgent issue when the clinic is closed that cannot wait until the next business day, you can page your doctor at the number below.    Please note that while we do our best to be available for urgent issues outside of office hours, we are not available 24/7.   If you have an urgent issue and are unable to reach [place], you may choose to seek medical care at your doctor's office, retail clinic, urgent care center, or emergency room.  If you have a medical emergency, please immediately call 911 or go to the emergency department.  Pager Numbers  - Dr. Gwen Pounds: [phone]  - Dr. Roseanne Reno:  [phone]  - Dr. Katrinka Blazing: [phone]   In the event of inclement weather, please call our main line at [phone] for an update on the status of any delays or closures.  Dermatology Medication Tips: Please keep the boxes that topical medications come in in order to help keep track of the instructions about where and how to use these. Pharmacies typically print the medication instructions only on the boxes and not directly on the medication tubes.   If your medication is too expensive, please contact our office at [phone] option 4 or send [place] a message through MyChart.   We are unable to tell what your co-pay for medications will be in advance as this is different depending on your insurance coverage. However, we may be able to find a substitute medication at lower cost or fill out paperwork to get insurance to cover a needed medication.  If a prior authorization is required to get your medication covered by your insurance company, please allow [place] 1-2 business days to complete this process.  Drug prices often vary depending on where the prescription is filled and some pharmacies may offer cheaper prices.  The website www.goodrx.com contains coupons for medications through different pharmacies. The prices here do not account for what the cost may be with help from insurance (it may be cheaper with your insurance), but the website can give you the price if you did not use any insurance.  - You can print the associated coupon and take it with your prescription to the pharmacy.  - You may also stop by our office during regular business hours and pick up a GoodRx coupon card.  - If you need your prescription sent electronically to a different pharmacy, notify our office through [facility] or by phone at [phone] option 4.     Si Usted Necesita Algo Despus de Su Visita  Tambin puede enviarnos un mensaje a travs de Clinical cytogeneticist. Por lo general respondemos a los mensajes de  MyChart en el transcurso de 1 a 2 das hbiles.  Para renovar recetas, por favor pida a su farmacia que se ponga en contacto con nuestra oficina. Annie Sable de fax es [place] [phone].  Si tiene un asunto urgente cuando la clnica est cerrada y que no puede esperar hasta el siguiente da hbil, puede llamar/localizar a su doctor(a) al nmero que aparece a continuacin.   Por favor, tenga en cuenta que aunque hacemos todo lo posible para estar disponibles para asuntos urgentes fuera del horario de [place], no estamos disponibles las 24 horas del da, los 7 [address]  [address] de la [place].   Si tiene un problema urgente y no puede comunicarse con nosotros, puede optar por buscar atencin mdica  en el consultorio de su doctor(a), en una clnica privada, en un centro de atencin urgente o en una sala de emergencias.  Si tiene Engineer, drilling, por favor llame inmediatamente al 911 o vaya a la sala de emergencias.  Nmeros de bper  - Dr. Gwen Pounds: [phone]  - Dra. Roseanne Reno: [phone]  - Dr. Katrinka Blazing: [phone]   En caso de inclemencias del tiempo, por favor llame a Lacy Duverney principal al [phone] para una actualizacin sobre el [place] de cualquier retraso o cierre.  Consejos para la medicacin en dermatologa: Por favor, guarde las cajas en las que vienen los medicamentos de uso tpico para ayudarle a seguir las instrucciones sobre dnde y cmo usarlos. Las farmacias generalmente imprimen las instrucciones del medicamento slo en las cajas y no directamente en los tubos del [place].   Si su medicamento es muy caro, por favor, pngase en contacto con Rolm Gala llamando al [phone] y presione la opcin 4 o envenos un mensaje a travs de Clinical cytogeneticist.   No podemos decirle cul ser su copago por los medicamentos por adelantado ya que esto es diferente dependiendo de la cobertura de su seguro. Sin embargo, es posible que podamos encontrar un medicamento sustituto a Advice worker un formulario para que el seguro cubra el medicamento que se considera necesario.   Si se requiere una autorizacin previa para que su compaa de seguros [place] su medicamento, por favor permtanos de 1 a 2 das hbiles para completar [address]  Los precios de los medicamentos varan con frecuencia dependiendo del Environmental consultant de dnde se surte la receta y alguna farmacias pueden ofrecer precios ms baratos.  El sitio web  www.goodrx.com tiene cupones para medicamentos de Health and safety inspector. Los precios aqu no tienen en cuenta lo que podra costar con la ayuda del seguro (puede ser ms barato con su seguro), pero el sitio web puede darle el precio si no utiliz Tourist information centre manager.  - Puede imprimir el cupn correspondiente y llevarlo con su receta a la farmacia.  - Tambin puede pasar por nuestra oficina durante el horario de atencin regular y Education officer, museum una tarjeta de cupones de GoodRx.  - Si necesita que su receta se enve electrnicamente a una farmacia diferente, informe a nuestra oficina a travs de MyChart de [place] o por telfono llamando al [phone] y presione la opcin 4.

## 2024-12-14 ENCOUNTER — Ambulatory Visit: Admitting: Dermatology
